# Patient Record
Sex: Female | Born: 1940 | Race: Black or African American | Hispanic: No | Smoking: Never smoker
Health system: Southern US, Community
[De-identification: ages and names within clinical notes are randomized; demographics above are authoritative.]

## PROBLEM LIST (undated history)

## (undated) DIAGNOSIS — R601 Generalized edema: Secondary | ICD-10-CM

## (undated) DIAGNOSIS — I10 Essential (primary) hypertension: Secondary | ICD-10-CM

## (undated) DIAGNOSIS — I472 Ventricular tachycardia: Secondary | ICD-10-CM

## (undated) DIAGNOSIS — E0590 Thyrotoxicosis, unspecified without thyrotoxic crisis or storm: Secondary | ICD-10-CM

## (undated) DIAGNOSIS — I4729 Other ventricular tachycardia: Secondary | ICD-10-CM

## (undated) DIAGNOSIS — D649 Anemia, unspecified: Secondary | ICD-10-CM

## (undated) DIAGNOSIS — E876 Hypokalemia: Secondary | ICD-10-CM

## (undated) DIAGNOSIS — R9431 Abnormal electrocardiogram [ECG] [EKG]: Secondary | ICD-10-CM

## (undated) DIAGNOSIS — E669 Obesity, unspecified: Secondary | ICD-10-CM

## (undated) DIAGNOSIS — I428 Other cardiomyopathies: Secondary | ICD-10-CM

## (undated) DIAGNOSIS — E049 Nontoxic goiter, unspecified: Secondary | ICD-10-CM

## (undated) DIAGNOSIS — I5022 Chronic systolic (congestive) heart failure: Secondary | ICD-10-CM

## (undated) HISTORY — PX: ABDOMINAL HYSTERECTOMY: SHX81

## (undated) HISTORY — PX: TUBAL LIGATION: SHX77

## (undated) HISTORY — PX: TONSILLECTOMY: SUR1361

## (undated) HISTORY — PX: OTHER SURGICAL HISTORY: SHX169

---

## 2007-06-01 ENCOUNTER — Emergency Department (HOSPITAL_COMMUNITY): Admission: EM | Admit: 2007-06-01 | Discharge: 2007-06-01 | Payer: Self-pay | Admitting: Emergency Medicine

## 2007-06-10 ENCOUNTER — Encounter: Admission: RE | Admit: 2007-06-10 | Discharge: 2007-06-10 | Payer: Self-pay | Admitting: Sports Medicine

## 2014-04-13 ENCOUNTER — Inpatient Hospital Stay (HOSPITAL_COMMUNITY)
Admission: EM | Admit: 2014-04-13 | Discharge: 2014-04-21 | DRG: 287 | Disposition: A | Discharge status: Home / Self Care | Payer: Medicare HMO | Attending: Internal Medicine | Admitting: Internal Medicine

## 2014-04-13 ENCOUNTER — Encounter (HOSPITAL_COMMUNITY): Payer: Self-pay | Admitting: Physical Medicine and Rehabilitation

## 2014-04-13 ENCOUNTER — Emergency Department (HOSPITAL_COMMUNITY): Payer: Medicare HMO

## 2014-04-13 DIAGNOSIS — I071 Rheumatic tricuspid insufficiency: Secondary | ICD-10-CM | POA: Diagnosis present

## 2014-04-13 DIAGNOSIS — E041 Nontoxic single thyroid nodule: Secondary | ICD-10-CM | POA: Diagnosis present

## 2014-04-13 DIAGNOSIS — E870 Hyperosmolality and hypernatremia: Secondary | ICD-10-CM | POA: Diagnosis present

## 2014-04-13 DIAGNOSIS — N189 Chronic kidney disease, unspecified: Secondary | ICD-10-CM | POA: Diagnosis present

## 2014-04-13 DIAGNOSIS — I129 Hypertensive chronic kidney disease with stage 1 through stage 4 chronic kidney disease, or unspecified chronic kidney disease: Secondary | ICD-10-CM | POA: Diagnosis present

## 2014-04-13 DIAGNOSIS — D61818 Other pancytopenia: Secondary | ICD-10-CM | POA: Diagnosis present

## 2014-04-13 DIAGNOSIS — E875 Hyperkalemia: Secondary | ICD-10-CM | POA: Diagnosis present

## 2014-04-13 DIAGNOSIS — R0602 Shortness of breath: Secondary | ICD-10-CM

## 2014-04-13 DIAGNOSIS — IMO0001 Reserved for inherently not codable concepts without codable children: Secondary | ICD-10-CM

## 2014-04-13 DIAGNOSIS — I429 Cardiomyopathy, unspecified: Secondary | ICD-10-CM | POA: Diagnosis present

## 2014-04-13 DIAGNOSIS — Z7982 Long term (current) use of aspirin: Secondary | ICD-10-CM | POA: Diagnosis not present

## 2014-04-13 DIAGNOSIS — R0600 Dyspnea, unspecified: Secondary | ICD-10-CM

## 2014-04-13 DIAGNOSIS — I272 Other secondary pulmonary hypertension: Secondary | ICD-10-CM | POA: Diagnosis present

## 2014-04-13 DIAGNOSIS — I472 Ventricular tachycardia: Secondary | ICD-10-CM | POA: Diagnosis present

## 2014-04-13 DIAGNOSIS — I10 Essential (primary) hypertension: Secondary | ICD-10-CM | POA: Diagnosis present

## 2014-04-13 DIAGNOSIS — D649 Anemia, unspecified: Secondary | ICD-10-CM | POA: Diagnosis present

## 2014-04-13 DIAGNOSIS — I5021 Acute systolic (congestive) heart failure: Secondary | ICD-10-CM | POA: Diagnosis present

## 2014-04-13 DIAGNOSIS — E876 Hypokalemia: Secondary | ICD-10-CM | POA: Diagnosis present

## 2014-04-13 DIAGNOSIS — E049 Nontoxic goiter, unspecified: Secondary | ICD-10-CM

## 2014-04-13 DIAGNOSIS — I428 Other cardiomyopathies: Secondary | ICD-10-CM

## 2014-04-13 DIAGNOSIS — I509 Heart failure, unspecified: Secondary | ICD-10-CM

## 2014-04-13 DIAGNOSIS — E8809 Other disorders of plasma-protein metabolism, not elsewhere classified: Secondary | ICD-10-CM | POA: Diagnosis present

## 2014-04-13 DIAGNOSIS — E0590 Thyrotoxicosis, unspecified without thyrotoxic crisis or storm: Secondary | ICD-10-CM | POA: Diagnosis present

## 2014-04-13 DIAGNOSIS — Z0389 Encounter for observation for other suspected diseases and conditions ruled out: Secondary | ICD-10-CM

## 2014-04-13 DIAGNOSIS — M7989 Other specified soft tissue disorders: Secondary | ICD-10-CM | POA: Diagnosis present

## 2014-04-13 DIAGNOSIS — I5023 Acute on chronic systolic (congestive) heart failure: Principal | ICD-10-CM | POA: Diagnosis present

## 2014-04-13 DIAGNOSIS — R9431 Abnormal electrocardiogram [ECG] [EKG]: Secondary | ICD-10-CM | POA: Diagnosis present

## 2014-04-13 DIAGNOSIS — R601 Generalized edema: Secondary | ICD-10-CM | POA: Diagnosis present

## 2014-04-13 HISTORY — DX: Essential (primary) hypertension: I10

## 2014-04-13 HISTORY — DX: Other ventricular tachycardia: I47.29

## 2014-04-13 HISTORY — DX: Abnormal electrocardiogram (ECG) (EKG): R94.31

## 2014-04-13 HISTORY — DX: Anemia, unspecified: D64.9

## 2014-04-13 HISTORY — DX: Other cardiomyopathies: I42.8

## 2014-04-13 HISTORY — DX: Generalized edema: R60.1

## 2014-04-13 HISTORY — DX: Chronic systolic (congestive) heart failure: I50.22

## 2014-04-13 HISTORY — DX: Hypokalemia: E87.6

## 2014-04-13 HISTORY — DX: Ventricular tachycardia: I47.2

## 2014-04-13 HISTORY — DX: Nontoxic goiter, unspecified: E04.9

## 2014-04-13 LAB — CBC WITH DIFFERENTIAL/PLATELET
Basophils Absolute: 0 10*3/uL (ref 0.0–0.1)
Basophils Relative: 0 % (ref 0–1)
Eosinophils Absolute: 0 10*3/uL (ref 0.0–0.7)
Eosinophils Relative: 1 % (ref 0–5)
HCT: 34.9 % — ABNORMAL LOW (ref 36.0–46.0)
Hemoglobin: 11 g/dL — ABNORMAL LOW (ref 12.0–15.0)
Lymphocytes Relative: 26 % (ref 12–46)
Lymphs Abs: 1.4 10*3/uL (ref 0.7–4.0)
MCH: 27.2 pg (ref 26.0–34.0)
MCHC: 31.5 g/dL (ref 30.0–36.0)
MCV: 86.4 fL (ref 78.0–100.0)
Monocytes Absolute: 0.6 10*3/uL (ref 0.1–1.0)
Monocytes Relative: 11 % (ref 3–12)
Neutro Abs: 3.3 10*3/uL (ref 1.7–7.7)
Neutrophils Relative %: 62 % (ref 43–77)
Platelets: 173 10*3/uL (ref 150–400)
RBC: 4.04 MIL/uL (ref 3.87–5.11)
RDW: 16.6 % — ABNORMAL HIGH (ref 11.5–15.5)
WBC: 5.3 10*3/uL (ref 4.0–10.5)

## 2014-04-13 LAB — TSH
TSH: 0.013 u[IU]/mL — ABNORMAL LOW (ref 0.350–4.500)
TSH: 0.046 u[IU]/mL — ABNORMAL LOW (ref 0.350–4.500)

## 2014-04-13 LAB — COMPREHENSIVE METABOLIC PANEL
ALT: 8 U/L (ref 0–35)
AST: 25 U/L (ref 0–37)
Albumin: 2.7 g/dL — ABNORMAL LOW (ref 3.5–5.2)
Alkaline Phosphatase: 111 U/L (ref 39–117)
Anion gap: 13 (ref 5–15)
BILIRUBIN TOTAL: 1.8 mg/dL — AB (ref 0.3–1.2)
BUN: 9 mg/dL (ref 6–23)
CALCIUM: 7.9 mg/dL — AB (ref 8.4–10.5)
CHLORIDE: 103 meq/L (ref 96–112)
CO2: 30 mmol/L (ref 19–32)
CREATININE: 0.33 mg/dL — AB (ref 0.50–1.10)
GFR calc Af Amer: >90 mL/min (ref >90)
GLUCOSE: 74 mg/dL (ref 70–99)
Potassium: 3.2 mmol/L — ABNORMAL LOW (ref 3.5–5.1)
Sodium: 146 mmol/L — ABNORMAL HIGH (ref 135–145)
Total Protein: 6.9 g/dL (ref 6.0–8.3)

## 2014-04-13 LAB — MAGNESIUM: Magnesium: 1.5 mg/dL (ref 1.5–2.5)

## 2014-04-13 LAB — I-STAT TROPONIN, ED: Troponin i, poc: 0.01 ng/mL (ref 0.00–0.08)

## 2014-04-13 LAB — BRAIN NATRIURETIC PEPTIDE: B Natriuretic Peptide: 1437.1 pg/mL — ABNORMAL HIGH (ref 0.0–100.0)

## 2014-04-13 MED ORDER — SODIUM CHLORIDE 0.9 % IJ SOLN
3.0000 mL | INTRAMUSCULAR | Status: DC | PRN
  Administered 2014-04-14: 3 mL via INTRAVENOUS
  Filled 2014-04-13: qty 3

## 2014-04-13 MED ORDER — POTASSIUM CHLORIDE CRYS ER 20 MEQ PO TBCR: 40.0000 meq | EXTENDED_RELEASE_TABLET | Freq: Once | ORAL | Status: DC

## 2014-04-13 MED ORDER — SODIUM CHLORIDE 0.9 % IV SOLN: 250.0000 mL | INTRAVENOUS | Status: DC | PRN

## 2014-04-13 MED ORDER — FUROSEMIDE 10 MG/ML IJ SOLN
40.0000 mg | Freq: Two times a day (BID) | INTRAMUSCULAR | Status: DC
  Administered 2014-04-13 – 2014-04-15 (×5): 40 mg via INTRAVENOUS
  Filled 2014-04-13 (×11): qty 4

## 2014-04-13 MED ORDER — LISINOPRIL 10 MG PO TABS
10.0000 mg | ORAL_TABLET | Freq: Every day | ORAL | Status: DC
  Administered 2014-04-13 – 2014-04-14 (×2): 10 mg via ORAL
  Filled 2014-04-13 (×3): qty 1

## 2014-04-13 MED ORDER — ALPRAZOLAM 0.25 MG PO TABS
0.2500 mg | ORAL_TABLET | Freq: Once | ORAL | Status: AC
  Administered 2014-04-13: 0.25 mg via ORAL
  Filled 2014-04-13: qty 1

## 2014-04-13 MED ORDER — ASPIRIN EC 81 MG PO TBEC
81.0000 mg | DELAYED_RELEASE_TABLET | Freq: Every day | ORAL | Status: DC
  Administered 2014-04-13 – 2014-04-21 (×8): 81 mg via ORAL
  Filled 2014-04-13 (×9): qty 1

## 2014-04-13 MED ORDER — HEPARIN SODIUM (PORCINE) 5000 UNIT/ML IJ SOLN
5000.0000 [IU] | Freq: Three times a day (TID) | INTRAMUSCULAR | Status: DC
  Administered 2014-04-13 – 2014-04-17 (×13): 5000 [IU] via SUBCUTANEOUS
  Filled 2014-04-13 (×19): qty 1

## 2014-04-13 MED ORDER — POTASSIUM CHLORIDE CRYS ER 20 MEQ PO TBCR
40.0000 meq | EXTENDED_RELEASE_TABLET | ORAL | Status: AC
  Administered 2014-04-13 (×2): 40 meq via ORAL

## 2014-04-13 MED ORDER — POTASSIUM CHLORIDE CRYS ER 20 MEQ PO TBCR
80.0000 meq | EXTENDED_RELEASE_TABLET | Freq: Once | ORAL | Status: DC
  Filled 2014-04-13: qty 4

## 2014-04-13 MED ORDER — SODIUM CHLORIDE 0.9 % IJ SOLN
3.0000 mL | Freq: Two times a day (BID) | INTRAMUSCULAR | Status: DC
  Administered 2014-04-13 – 2014-04-21 (×13): 3 mL via INTRAVENOUS

## 2014-04-13 MED ORDER — ACETAMINOPHEN 325 MG PO TABS
650.0000 mg | ORAL_TABLET | ORAL | Status: DC | PRN
  Administered 2014-04-13 – 2014-04-17 (×3): 650 mg via ORAL
  Filled 2014-04-13 (×3): qty 2

## 2014-04-13 MED ORDER — ONDANSETRON HCL 4 MG/2ML IJ SOLN: 4.0000 mg | Freq: Four times a day (QID) | INTRAMUSCULAR | Status: DC | PRN

## 2014-04-13 NOTE — ED Notes (Signed)
Attempted report 

## 2014-04-13 NOTE — ED Notes (Signed)
Pt refused wheelchair in triage, states "I can walk, I have legs."

## 2014-04-13 NOTE — Progress Notes (Signed)
Patient is very anxious and appears to be having difficulty relaxing which is making heart rate slightly elevated to the 120's and also when going the restroom. Notified on-call doctor, Dr. Shirlee Latch. Orders given for one time dose Xanax per order. Will administer as ordered and continue to monitor patient to end of shift.

## 2014-04-13 NOTE — ED Notes (Signed)
Pt presents to department for evaluation of SOB. Ongoing x2 week. Pt states she has difficulty breathing with exertion. Denies chest pain. Respirations unlabored. Speaking complete sentences. Pt is alert and oriented x4.

## 2014-04-13 NOTE — H&P (Signed)
Patient ID: Leslie Kline MRN: [id number], DOB/AGE: 73/10/1940   Admit date: 04/13/2014   Primary Physician: No established PCP Primary Cardiologist: New  Pt. Profile: 74 year old female with no past medical history presenting to the ED with complaints of worsening dyspnea x one week.   Problem List  Past Medical History  Diagnosis Date  . Hypertension     History reviewed. No pertinent past surgical history.   Allergies  No Known Allergies  HPI The patient is a 74 year old African-American female with no known past medical history. She does not seek routine medical/preventative care. She reports that she has not been evaluated by a medical provider in over 6 years. She takes no medications at home. She denies any family history of heart disease. She denies any personal history of tobacco use, past or present. 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  She was in her usual state of health until one week ago when she developed dyspnea on exertion. She also notes a nonproductive cough that started last week. She denies any associated fevers or chills, nausea vomiting or diarrhea. No associated chest pain, lightheadedness, dizziness, syncope/near-syncope. She has had no dyspnea at rest. Symptoms occur with exertion. She denies orthopnea/PND but notes bilateral lower extremity edema. Symptoms have progressively worsened over the last week. She denies melena/hematochezia. In regards to her diet, she denies any excessive salt intake.  In the ED, workup is notable for an elevated BNP at at 1,437. Initial troponin is negative. CBC demonstrates mild anemia with Hgb of 11. BMP is notable for mild hypokalemia with K of 3.2 and hypoalbuminemia at 2.7. Scr is 0.33. CXR demonstrates moderate to severe cardiomegaly,  moderate right and small left pleural effusions with associated lung base collapse vs consolidation. EKG demonstrates sinus tach with ventricular rates in  the 120s and prolonged QT/QTc of 402/577.    Home Medications  Prior to Admission medications   Medication Sig Start Date End Date Taking? Authorizing Provider  aspirin 325 MG EC tablet Take 325 mg by mouth every 6 (six) hours as needed for pain.   Yes Historical Provider, MD  aspirin-sod bicarb-citric acid (ALKA-SELTZER) 325 MG TBEF tablet Take 325 mg by mouth every 6 (six) hours as needed.   Yes Historical Provider, MD    Family History  Family History  Problem Relation Age of Onset  . Diabetes Maternal Grandfather     Social History  History   Social History  . Marital Status: Single    Spouse Name: N/A    Number of Children: N/A  . Years of Education: N/A   Occupational History  . Not on file.   Social History Main Topics  . Smoking status: Never Smoker   . Smokeless tobacco: Not on file  . Alcohol Use: No  . Drug Use: No  . Sexual Activity: Not on file   Other Topics Concern  . Not on file   Social History Narrative  . No narrative on file     Review of Systems General:  No chills, fever, night sweats or weight changes.  Cardiovascular:  No chest pain, dyspnea on exertion, edema, orthopnea, palpitations, paroxysmal nocturnal dyspnea. Dermatological: No rash, lesions/masses Respiratory: No cough, dyspnea Urologic: No hematuria, dysuria Abdominal:   No nausea, vomiting, diarrhea, bright red blood per rectum, melena, or hematemesis Neurologic:  No visual changes, wkns, changes in mental status. All other systems reviewed and are otherwise negative except as noted above.  Physical Exam  Blood pressure 126/79, pulse 112, temperature 97.6 F (36.4 C), temperature source Oral, resp. rate 21, SpO2 97 %.  General: Pleasant, NAD Psych: Normal affect. Neuro: Alert and oriented X 3. Moves all extremities spontaneously. HEENT: Normal  Neck: Supple without bruits. + JVD. Lungs:  Decreased BS over RLL, no wheenzing, rhonchi or rales Heart: tachy rate, regular  rhythm no s3, s4, or murmurs. Abdomen: Soft, non-tender, non-distended, BS + x 4.  Extremities: No clubbing or cyanosis. DP/PT/Radials 2+ and equal bilaterally. 2+ bilateral LE pitting edema  Labs  Troponin (Point of Care Test)  Recent Labs  04/13/14 1428  TROPIPOC 0.01   No results for input(s): CKTOTAL, CKMB, TROPONINI in the last 72 hours. Lab Results  Component Value Date   WBC 5.3 04/13/2014   HGB 11.0* 04/13/2014   HCT 34.9* 04/13/2014   MCV 86.4 04/13/2014   PLT 173 04/13/2014     Recent Labs Lab 04/13/14 1418  NA 146*  K 3.2*  CL 103  CO2 30  BUN 9  CREATININE 0.33*  CALCIUM 7.9*  PROT 6.9  BILITOT 1.8*  ALKPHOS 111  ALT 8  AST 25  GLUCOSE 74   No results found for: CHOL, HDL, LDLCALC, TRIG No results found for: DDIMER   Radiology/Studies  Dg Chest 2 View  04/13/2014   CLINICAL DATA:  74 year old female with shortness of Breath. Initial encounter.  EXAM: CHEST  2 VIEW  COMPARISON:  None.  FINDINGS: Cardiomegaly. Moderate right and small left pleural effusions. Associated bibasilar opacification. No pneumothorax. No acute pulmonary edema. Leftward deviation of the trachea at the thoracic inlet suggesting right thyroid goiter. Small surgical clips at the level of the left thyroid bed. No acute osseous abnormality identified.  IMPRESSION: 1. Moderate to severe cardiomegaly, consider pericardial effusion. 2. Moderate right and small left pleural effusions with associated lung base collapse or consolidation. 3. 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 Possible previous left thyroidectomy.   Electronically Signed   By: Augusto Gamble M.D.   On: 04/13/2014 13:11    ECG  Sinus tach with prolonged QT/QTC of 402/577   ASSESSMENT AND PLAN  1. Acute CHF: Worsening DOE x 1 week. PE notable for elevated JVD, 2+ bilateral pitting LEE and decreased breath sounds at the bases. Labs are notable for elevated BNP at 1,437. CXR notable for moderate to severe cardiomegaly and moderate  right pleural effusion and mild left pleural effusion. She is hemodynamically stable. Diastolic BP is elevated at 100. O2 stats fluctuating in the low 90s-upper 80s. Will place on supplemental O2. Will obtain an echo to r/o pericardial effusion and will also assess wall motion as well as systolic and diastolic function. Will also check TSH. Renal function is normal. Will treat with IV Lasix. 40 mg BID. Will start 10 mg of lisinopril for afterload reduction. May also consider adding hydralazine + nitrate later if BP allows. Will consider initiating a BB tomorrow (likely Coreg) after she diuresis.  Will order strict I/Os, daily weights and low sodium diet. Daily BMP to closely follow electrolytes. If echo shows decreased systolic function, she will need a LHC after she diuresis.  2. Hypokalemia: Etiology unknown. She has not been on any diuretics as an OP and denies and recent diarrhea or vomiting. Will repleat with supplemental K-Dur and will continue to follow closely. Will check Mg.  3. Prolonged QT: not on any home medications. She is hypokalemic. Will correct with supplemental K and will check Mg. Will avoid any  potential QT prolonging agents, including PRN Zofran.     Signed, Robbie Lis, PA-C 04/13/2014, 4:48 PM  Personally seen and examined. Agree with above.  74 year old with no prior medical contact here with  acute systolic heart failure.  Exam reveals bilateral lower extremity edema, tachycardia with S3 gallop, elevated JVD jawline.  It is plausible that she has developed hypertensive cardiomyopathy based up on her blood pressures or perhaps viral cardiomyopathy given the acute onset of this illness. She was in her usual state of health up until approximately 1 week ago.  -Aggressive diuresis with IV Lasix -Echocardiogram -TSH -If ejection fraction is markedly reduced as suspected, after acute decompensation episode, she will need heart catheterization to further evaluate her  anatomy. -Aggressively replete potassium -Aggressive afterload reduction. Blood pressure should improve with diuresis. -Initiate beta blocker once volume reduced.  Donato Schultz, MD

## 2014-04-13 NOTE — ED Notes (Signed)
Patient has had a snack and has been back to the bathroom.  She is able to ambulate with little to no assist.

## 2014-04-13 NOTE — ED Provider Notes (Signed)
CSN: [id number]     Arrival date & time 04/13/14  1221 History   First MD Initiated Contact with Patient 04/13/14 1235     Chief Complaint  Patient presents with  . Shortness of Breath     (Consider location/radiation/quality/duration/timing/severity/associated sxs/prior Treatment) HPI  Leslie Kline is a 74 y.o. female with PMH of hypertension, cardiomegaly presenting with 2 weeks worth of shortness of breath worse with exertion. Patient states she also has difficulty breathing with exertion. She denies worsening lower leg and down. She has no home inhalers. She states she was never smoker. She denies any chest pain. She denies any nausea, vomiting, diaphoresis, dizziness. No syncope or falls. Patient denies history of malignancy, DVT, PE, recent surgery or trauma, immobilization, hemoptysis, immobilization. Patient states at times she feels warm and Hot room but denies fevers, chills, cough, congestion. Patient has not seen a general doctor in many years. Patient with a lot of stress losing her husband and son in the past couple years.   Past Medical History  Diagnosis Date  . Hypertension    History reviewed. No pertinent past surgical history. Family History  Problem Relation Age of Onset  . Diabetes Maternal Grandfather    History  Substance Use Topics  . Smoking status: Never Smoker   . Smokeless tobacco: Not on file  . Alcohol Use: No   OB History    No data available     Review of Systems 10 Systems reviewed and are negative for acute change except as noted in the HPI.    Allergies  Review of patient's allergies indicates no known allergies.  Home Medications   Prior to Admission medications   Medication Sig Start Date End Date Taking? Authorizing Provider  aspirin 325 MG EC tablet Take 325 mg by mouth every 6 (six) hours as needed for pain.   Yes Historical Provider, MD  aspirin-sod bicarb-citric acid (ALKA-SELTZER) 325 MG TBEF tablet Take 325 mg by mouth every 6  (six) hours as needed.   Yes Historical Provider, MD   BP 153/96 mmHg  Pulse 120  Temp(Src) 97.6 F (36.4 C) (Oral)  Resp 25  SpO2 83% Physical Exam  Constitutional: She appears well-developed and well-nourished. No distress.  HENT:  Head: Normocephalic and atraumatic.  Eyes: Conjunctivae and EOM are normal. Right eye exhibits no discharge. Left eye exhibits no discharge.  Neck: JVD present.  Cardiovascular: Regular rhythm.   Tachycardia 2+ pitting edema to bilateral knees. Patient denies tenderness. tenderness. no palpable cords.  Pulmonary/Chest: Effort normal and breath sounds normal. No respiratory distress. She has no wheezes.  Abdominal: Soft. Bowel sounds are normal. She exhibits no distension. There is no tenderness.  Neurological: She is alert. She exhibits normal muscle tone. Coordination normal.  Skin: Skin is warm and dry. She is not diaphoretic.  Nursing note and vitals reviewed.   ED Course  Procedures (including critical care time) Labs Review Labs Reviewed  BRAIN NATRIURETIC PEPTIDE - Abnormal; Notable for the following:    B Natriuretic Peptide 1437.1 (*)    All other components within normal limits  CBC WITH DIFFERENTIAL - Abnormal; Notable for the following:    Hemoglobin 11.0 (*)    HCT 34.9 (*)    RDW 16.6 (*)    All other components within normal limits  COMPREHENSIVE METABOLIC PANEL - Abnormal; Notable for the following:    Sodium 146 (*)    Potassium 3.2 (*)    Creatinine, Ser 0.33 (*)    Calcium  7.9 (*)    Albumin 2.7 (*)    Total Bilirubin 1.8 (*)    All other components within normal limits  TSH - Abnormal; Notable for the following:    TSH 0.013 (*)    All other components within normal limits  Rosezena Sensor, ED    Imaging Review Dg Chest 2 View  04/13/2014   CLINICAL DATA:  74 year old female with shortness of Breath. Initial encounter.  EXAM: CHEST  2 VIEW  COMPARISON:  None.  FINDINGS: Cardiomegaly. Moderate right and small left  pleural effusions. Associated bibasilar opacification. No pneumothorax. No acute pulmonary edema. Leftward deviation of the trachea at the thoracic inlet suggesting right thyroid goiter. Small surgical clips at the level of the left thyroid bed. No acute osseous abnormality identified.  IMPRESSION: 1. Moderate to severe cardiomegaly, consider pericardial effusion. 2. Moderate right and small left pleural effusions with associated lung base collapse or consolidation. 3. 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 Possible previous left thyroidectomy.   Electronically Signed   By: Augusto Gamble M.D.   On: 04/13/2014 13:11     EKG Interpretation   Date/Time:  Wednesday April 13 2014 12:30:54 EST Ventricular Rate:  124 PR Interval:  128 QRS Duration: 82 QT Interval:  402 QTC Calculation: 577 R Axis:   71 Text Interpretation:  Critical Test Result: Long QTc Sinus tachycardia  Anteroseptal infarct , age undetermined Abnormal ECG No old tracing to  compare Confirmed by CAMPOS  MD, Caryn Bee ([id number]) on 04/13/2014 2:44:34 PM      MDM   Final diagnoses:  Congestive heart failure, unspecified congestive heart failure chronicity, unspecified congestive heart failure type  Dyspnea   Patient with history of cardiomegaly presenting with persistent shortness of breath as well as bilateral pitting edema. Patient denies any chest pain at this time. She is tachycardic and tachypnea. No hypoxia. EKG with sinus tachycardia as well as prolonged QT. Initial troponin negative. Patient also with hyperkalemia to 3.2 and anemia. Patient with moderate right and small left pleural effusions with possible lung base collapse or consolidation. Patient also with moderate to severe cardiomegaly. No frank pulmonary edema. Patient with BNP of 1400. Patient with likely congestive heart failure exacerbation. Consult to cardiology who agrees to evaluate the patient with plan for admission.  Discussed all results and patient verbalizes  understanding and agrees with plan.  This is a shared patient. This patient was discussed with the physician, Dr. Patria Mane who saw and evaluated the patient and agrees with the plan.     Louann Sjogren, PA-C 04/13/14 1702  Lyanne Co, MD 04/14/14 [phone]

## 2014-04-14 ENCOUNTER — Encounter (HOSPITAL_COMMUNITY): Payer: Self-pay | Admitting: General Practice

## 2014-04-14 DIAGNOSIS — I509 Heart failure, unspecified: Secondary | ICD-10-CM

## 2014-04-14 DIAGNOSIS — R0600 Dyspnea, unspecified: Secondary | ICD-10-CM | POA: Insufficient documentation

## 2014-04-14 LAB — BASIC METABOLIC PANEL
Anion gap: 9 (ref 5–15)
BUN: 9 mg/dL (ref 6–23)
CO2: 32 mmol/L (ref 19–32)
CREATININE: 0.35 mg/dL — AB (ref 0.50–1.10)
Calcium: 7.5 mg/dL — ABNORMAL LOW (ref 8.4–10.5)
Chloride: 103 mEq/L (ref 96–112)
GFR calc Af Amer: >90 mL/min (ref >90)
GFR calc non Af Amer: >90 mL/min (ref >90)
Glucose, Bld: 89 mg/dL (ref 70–99)
Potassium: 2.5 mmol/L — CL (ref 3.5–5.1)
Sodium: 144 mmol/L (ref 135–145)

## 2014-04-14 LAB — POTASSIUM: Potassium: 4 mmol/L (ref 3.5–5.1)

## 2014-04-14 MED ORDER — LORAZEPAM 2 MG/ML IJ SOLN
0.5000 mg | Freq: Three times a day (TID) | INTRAMUSCULAR | Status: AC | PRN
  Administered 2014-04-15 – 2014-04-19 (×2): 0.5 mg via INTRAVENOUS
  Filled 2014-04-14 (×2): qty 1

## 2014-04-14 MED ORDER — DIPHENHYDRAMINE HCL 25 MG PO CAPS
25.0000 mg | ORAL_CAPSULE | Freq: Once | ORAL | Status: AC
  Administered 2014-04-15: 25 mg via ORAL
  Filled 2014-04-14: qty 1

## 2014-04-14 MED ORDER — POTASSIUM CHLORIDE CRYS ER 20 MEQ PO TBCR
40.0000 meq | EXTENDED_RELEASE_TABLET | ORAL | Status: AC
  Administered 2014-04-14 (×3): 40 meq via ORAL
  Filled 2014-04-14 (×3): qty 2

## 2014-04-14 MED ORDER — POTASSIUM CHLORIDE CRYS ER 20 MEQ PO TBCR
40.0000 meq | EXTENDED_RELEASE_TABLET | Freq: Three times a day (TID) | ORAL | Status: DC
  Administered 2014-04-14 – 2014-04-18 (×13): 40 meq via ORAL
  Filled 2014-04-14 (×19): qty 2

## 2014-04-14 NOTE — Progress Notes (Signed)
CRITICAL VALUE ALERT  Critical value received:  Potassium - 2.5  Date of notification:  04/14/2014  Time of notification:  0559  Critical value read back:Yes.    Nurse who received alert:  Milta Deiters  MD notified (1st page): Ward Givens, NP  Time of first page:  0615  MD notified (2nd page): n/a  Time of second page: n/a  Responding MD: Ward Givens, NP  Time MD responded:  [phone]

## 2014-04-14 NOTE — Care Management Note (Unsigned)
    Page 1 of 1   04/20/2014     5:50:12 PM CARE MANAGEMENT NOTE 04/20/2014  Patient:  Leslie Kline, Leslie Kline   Account Number:  [id number]  Date Initiated:  04/14/2014  Documentation initiated by:  Floyce Bujak  Subjective/Objective Assessment:   Pt adm on 04/13/14 with acute CHF.  PTA, pt resides at home with grandchildren.     Action/Plan:   Will follow for dc needs as pt progresses.   Anticipated DC Date:  04/21/2014   Anticipated DC Plan:  HOME W HOME HEALTH SERVICES      DC Planning Services  CM consult      Choice offered to / List presented to:     DME arranged  OTHER - SEE COMMENT      DME agency  OTHER - SEE NOTE        Status of service:  In process, will continue to follow Medicare Important Message given?  YES (If response is "NO", the following Medicare IM given date fields will be blank) Date Medicare IM given:  04/18/2014 Medicare IM given by:  Tiphany Fayson Date Additional Medicare IM given:   Additional Medicare IM given by:    Discharge Disposition:    Per UR Regulation:  Reviewed for med. necessity/level of care/duration of stay  If discussed at Long Length of Stay Meetings, dates discussed:    Comments:  04/20/14 Sidney Ace, RN, BSN [phone] Pt to be fitted with Lifevest on 1/28, per [employer] rep, then plan for dc.  04/19/14 Sidney Ace, RN, BSN [phone] PT recommending no OP follow up at dc.  Noted MD considering Lifevest.  04/18/14 Sidney Ace, RN, BSN [phone] Cath today; cont IV diuresisi.  Will cont to follow progress.

## 2014-04-14 NOTE — Progress Notes (Signed)
Echocardiogram 2D Echocardiogram has been performed.  Leslie Kline 04/14/2014, 10:21 AM

## 2014-04-14 NOTE — Progress Notes (Signed)
During the night patient was getting out of bed independently to the bathroom on several occassions after being advised to call for nurse when she needs to get up. Patient felt that nurse was hindering her when nurse suggested to put the bedside commode next to bed for safety issues for the patient appeared less steady in ambulating to the bathroom during the night. Patient is very independent and states she will not fall. Explained to patient the risk of not calling for assistance when she is weak on her feet. Patient agreed to some extent and agreed to call patient. Will continue to monitor patient to end of shift.

## 2014-04-14 NOTE — Progress Notes (Signed)
Patient Name: Leslie Kline Date of Encounter: 04/14/2014     Active Problems:   Acute CHF    SUBJECTIVE  Denies ever had any CP. States she had L sided thyroidectomy for goiter in the past. Does not have a PCP. States breathing got better after IV lasix.   CURRENT MEDS . aspirin EC  81 mg Oral Daily  . furosemide  40 mg Intravenous BID  . heparin  5,000 Units Subcutaneous 3 times per day  . lisinopril  10 mg Oral Daily  . potassium chloride  40 mEq Oral TID  . sodium chloride  3 mL Intravenous Q12H    OBJECTIVE  Filed Vitals:   04/13/14 1859 04/13/14 1951 04/14/14 0427 04/14/14 1031  BP: 120/92 140/95 146/68 98/52  Pulse: 110 122 100   Temp: 97 F (36.1 C) 98.6 F (37 C) 98 F (36.7 C)   TempSrc: Oral Oral Axillary   Resp: 20 18 18    Height: 5\' 6"  (1.676 m)     Weight: 171 lb 6.4 oz (77.747 kg)  169 lb 4.8 oz (76.794 kg)   SpO2: 100% 100% 100%     Intake/Output Summary (Last 24 hours) at 04/14/14 1042 Last data filed at 04/14/14 1020  Gross per 24 hour  Intake   1220 ml  Output   1950 ml  Net   -730 ml   Filed Weights   04/13/14 1859 04/14/14 0427  Weight: 171 lb 6.4 oz (77.747 kg) 169 lb 4.8 oz (76.794 kg)    PHYSICAL EXAM  General: Pleasant, NAD. Neuro: Alert and oriented X 3. Moves all extremities spontaneously. Psych: Normal affect. HEENT:  Normal  Neck: Supple without bruits or JVD. Lungs:  Resp regular and unlabored, CTA. Heart: mildly tachycardic. no s3, s4, or murmurs. Abdomen: Soft, non-tender, non-distended, BS + x 4.  Extremities: No clubbing, cyanosis. DP/PT/Radials 2+ and equal bilaterally. 1-2+ pitting edema in LE  Accessory Clinical Findings  CBC  Recent Labs  04/13/14 1418  WBC 5.3  NEUTROABS 3.3  HGB 11.0*  HCT 34.9*  MCV 86.4  PLT 173   Basic Metabolic Panel  Recent Labs  04/13/14 1418 04/13/14 2032 04/14/14 0440  NA 146*  --  144  K 3.2*  --  2.5*  CL 103  --  103  CO2 30  --  32  GLUCOSE 74  --  89  BUN 9   --  9  CREATININE 0.33*  --  0.35*  CALCIUM 7.9*  --  7.5*  MG  --  1.5  --    Liver Function Tests  Recent Labs  04/13/14 1418  AST 25  ALT 8  ALKPHOS 111  BILITOT 1.8*  PROT 6.9  ALBUMIN 2.7*   Thyroid Function Tests  Recent Labs  04/13/14 2032  TSH 0.046*    TELE Sinus tach with HR 100s    ECG  No new EKG  Echocardiogram  pending    Radiology/Studies  Dg Chest 2 View  04/13/2014   CLINICAL DATA:  74 year old female with shortness of Breath. Initial encounter.  EXAM: CHEST  2 VIEW  COMPARISON:  None.  FINDINGS: Cardiomegaly. Moderate right and small left pleural effusions. Associated bibasilar opacification. No pneumothorax. No acute pulmonary edema. Leftward deviation of the trachea at the thoracic inlet suggesting right thyroid goiter. Small surgical clips at the level of the left thyroid bed. No acute osseous abnormality identified.  IMPRESSION: 1. Moderate to severe cardiomegaly, consider pericardial effusion. 2. Moderate right  and small left pleural effusions with associated lung base collapse or consolidation. 3. 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 Possible previous left thyroidectomy.   Electronically Signed   By: Augusto Gamble M.D.   On: 04/13/2014 13:11    ASSESSMENT AND PLAN  74 year old with no prior medical contact here with acute systolic heart failure.  1. Acute CHF with worsening DOE x 1wk  - CXR shows moderate R pleural effusion and mild L pleural effusion. BNP 1437  - continue IV diuresis, if EF low, may consider L&R heart cath vs myoview as well, however given new finding of abnormal thyroid, her condition may be thyroid related  - pending echo. Consider add coreg later once BP allows  2. Likely hyperthyroidism, TSH 0.046 this morning  - check free T4. 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  HTN 4. Hypokalemia 5. Prolonged QT  Signed, Azalee Course PA-C Pager: [phone]

## 2014-04-14 NOTE — Progress Notes (Signed)
Notified on-call, Ward Givens, NP of low potassium of 2.5. Orders given. Will carry out orders and continue to monitor patient to end of shift.

## 2014-04-15 DIAGNOSIS — E0590 Thyrotoxicosis, unspecified without thyrotoxic crisis or storm: Secondary | ICD-10-CM

## 2014-04-15 DIAGNOSIS — R9431 Abnormal electrocardiogram [ECG] [EKG]: Secondary | ICD-10-CM | POA: Diagnosis present

## 2014-04-15 DIAGNOSIS — I5082 Biventricular heart failure: Secondary | ICD-10-CM | POA: Insufficient documentation

## 2014-04-15 DIAGNOSIS — E876 Hypokalemia: Secondary | ICD-10-CM | POA: Diagnosis present

## 2014-04-15 DIAGNOSIS — E049 Nontoxic goiter, unspecified: Secondary | ICD-10-CM

## 2014-04-15 DIAGNOSIS — D649 Anemia, unspecified: Secondary | ICD-10-CM | POA: Diagnosis present

## 2014-04-15 LAB — T4, FREE: Free T4: 5.36 ng/dL — ABNORMAL HIGH (ref 0.80–1.80)

## 2014-04-15 LAB — BASIC METABOLIC PANEL
ANION GAP: 10 (ref 5–15)
BUN: 11 mg/dL (ref 6–23)
CO2: 30 mmol/L (ref 19–32)
Calcium: 7.8 mg/dL — ABNORMAL LOW (ref 8.4–10.5)
Chloride: 107 mEq/L (ref 96–112)
Creatinine, Ser: 0.47 mg/dL — ABNORMAL LOW (ref 0.50–1.10)
GFR calc Af Amer: >90 mL/min (ref >90)
GFR calc non Af Amer: >90 mL/min (ref >90)
Glucose, Bld: 101 mg/dL — ABNORMAL HIGH (ref 70–99)
Potassium: 3.9 mmol/L (ref 3.5–5.1)
SODIUM: 147 mmol/L — AB (ref 135–145)

## 2014-04-15 LAB — MAGNESIUM: Magnesium: 1.4 mg/dL — ABNORMAL LOW (ref 1.5–2.5)

## 2014-04-15 MED ORDER — LISINOPRIL 2.5 MG PO TABS
2.5000 mg | ORAL_TABLET | Freq: Every day | ORAL | Status: DC
  Administered 2014-04-15 – 2014-04-21 (×6): 2.5 mg via ORAL
  Filled 2014-04-15 (×7): qty 1

## 2014-04-15 MED ORDER — CARVEDILOL 12.5 MG PO TABS
12.5000 mg | ORAL_TABLET | Freq: Two times a day (BID) | ORAL | Status: DC
  Administered 2014-04-15 – 2014-04-21 (×13): 12.5 mg via ORAL
  Filled 2014-04-15 (×14): qty 1

## 2014-04-15 MED ORDER — MAGNESIUM SULFATE 4 GM/100ML IV SOLN
4.0000 g | Freq: Once | INTRAVENOUS | Status: AC
  Administered 2014-04-15: 4 g via INTRAVENOUS
  Filled 2014-04-15: qty 100

## 2014-04-15 MED ORDER — METOPROLOL TARTRATE 25 MG PO TABS
25.0000 mg | ORAL_TABLET | Freq: Two times a day (BID) | ORAL | Status: DC
  Administered 2014-04-15: 25 mg via ORAL
  Filled 2014-04-15 (×2): qty 1

## 2014-04-15 MED ORDER — MAGNESIUM OXIDE 400 (241.3 MG) MG PO TABS
400.0000 mg | ORAL_TABLET | Freq: Every day | ORAL | Status: DC
  Administered 2014-04-16 – 2014-04-21 (×5): 400 mg via ORAL
  Filled 2014-04-15 (×6): qty 1

## 2014-04-15 MED ORDER — METHIMAZOLE 10 MG PO TABS
10.0000 mg | ORAL_TABLET | Freq: Three times a day (TID) | ORAL | Status: DC
  Administered 2014-04-15 – 2014-04-21 (×19): 10 mg via ORAL
  Filled 2014-04-15 (×21): qty 1

## 2014-04-15 NOTE — Progress Notes (Signed)
Pt wants to sit in the recliner chair, refused chair alarm, pt got irritable when told to call for help and verbalized that she can do everything for herself and doesn't need help, reminded pt that it's for her safety but got more agitated.

## 2014-04-15 NOTE — Consult Note (Signed)
[facility] Medical Consultation  Leslie Kline VAN:[id number] DOB: 11/30/1940 DOA: 04/13/2014 PCP: No primary care provider on file.   Requesting physician: cardiology Date of consultation: 04/15/14 Reason for consultation: hyperthyroidism  Impression/Recommendations Active Problems:   Acute systolic CHF (congestive heart failure)   Dyspnea   Biventricular heart failure   Hyperthyroidism   Thyroid goiter   Prolonged QT interval   Hypomagnesemia   Hypokalemia   Anemia   Primary Hyperthyroidism: Elevated free t4 and suppressed tsh, . Free t3 ordered, and TRab ordered for further evaluation. Methimazole started today by cardiology. Recommend continue methimazole. She would optimally need a radio iodine uptake scan before starting methimazole, and it should be done atleast 6 to 8 weeks after getting the IV contrast and atleast be a week off the methimazole before we do the radio iodine uptake scan. Patient scheduled for cardiac cath on Monday  for severe systolic heart failure. In view of her severity of the heart failure, recommend outpatient work up with the scan and a referral for endocrinology when she gets discharged.  We can get an plain US thyroid to evaluate further  Acute systolic heart failure Further management as per cardiology   Right  Fore arm swelling.  Unclear if its new or old. Will order US dopplers of the right upper extremity. She denies any pain and there is no erythema.   Hypertension Better controlled.   Mild hypernatremia Monitor.   Hypomagnesemia:  Will be replaced as needed.   I will followup again tomorrow. Please contact me if I can be of assistance in the meanwhile. Thank you for this consultation.  Chief Complaint: sob on exertion.   HPI:  74 year old lady with prior h/o hypertension, thyroid disease, s/p left thyroidectomy 15 years ago by Dr Nancy Marus, comes in for worsening sob on exertion. She also reports unintentional weight loss of 260 lbs to  165 in the last one year. She reports fatigue and palpitations occasionally. She was admitted to cardiology service for heart failure and was started on IV lasix for diuresis. Her thyroid panel revealed suppressed TSH and elevated free t4 and we were consulted for further evaluation. cxr showed right thyroid goiter.   Review of Systems:  See HPI for pertinent positive history. Patient had sparing medical care prior to admission and is very vague about her symptoms and not a great historian.   Past Medical History  Diagnosis Date  . Hypertension   . CHF (congestive heart failure)   . Hypokalemia 04/13/2014  . Shortness of breath dyspnea    Past Surgical History  Procedure Laterality Date  . Tubal ligation    . Goiter    . Tonsillectomy    . Abdominal hysterectomy     Social History:  reports that she has never smoked. She has never used smokeless tobacco. She reports that she does not drink alcohol or use illicit drugs.  No Known Allergies Family History  Problem Relation Age of Onset  . Diabetes Maternal Grandfather     Prior to Admission medications   Medication Sig Start Date End Date Taking? Authorizing Provider  aspirin 325 MG EC tablet Take 325 mg by mouth every 6 (six) hours as needed for pain.   Yes Historical Provider, MD  aspirin-sod bicarb-citric acid (ALKA-SELTZER) 325 MG TBEF tablet Take 325 mg by mouth every 6 (six) hours as needed.   Yes Historical Provider, MD   Physical Exam: Blood pressure 124/83, pulse 110, temperature 98 F (36.7 C), temperature  source Oral, resp. rate 16, height  (1.676 m), weight 75.479 kg (166 lb 6.4 oz), SpO2 98 %. Filed Vitals:   04/15/14 1044  BP: 124/83  Pulse: 110  Temp:   Resp:      General:  Alert afebrile comfortable  Eyes: PERLA, non icteric.   Neck: right sided goiter present.   Cardiovascular: s1s2,  Systolic murmer 2/6  present.   Respiratory: diminished breath sounds at bases, no wheezing or  rhonchi  Abdomen: soft non tender non distended bowel sounds heard  Skin: no rash  Musculoskeletal: swelling of the right forearm when compared to left forearm.  Neurologic: no focal deficits. Speech normal. No facial droop. Able to move all extremities. Gait was not tested.   Labs on Admission:  Basic Metabolic Panel:  Recent Labs Lab 04/13/14 1418 04/13/14 2032 04/14/14 0440 04/14/14 1447 04/15/14 0340 04/15/14 0402  NA 146*  --  144  --  147*  --   K 3.2*  --  2.5* 4.0 3.9  --   CL 103  --  103  --  107  --   CO2 30  --  32  --  30  --   GLUCOSE 74  --  89  --  101*  --   BUN 9  --  9  --  11  --   CREATININE 0.33*  --  0.35*  --  0.47*  --   CALCIUM 7.9*  --  7.5*  --  7.8*  --   MG  --  1.5  --   --   --  1.4*   Liver Function Tests:  Recent Labs Lab 04/13/14 1418  AST 25  ALT 8  ALKPHOS 111  BILITOT 1.8*  PROT 6.9  ALBUMIN 2.7*   No results for input(s): LIPASE, AMYLASE in the last 168 hours. No results for input(s): AMMONIA in the last 168 hours. CBC:  Recent Labs Lab 04/13/14 1418  WBC 5.3  NEUTROABS 3.3  HGB 11.0*  HCT 34.9*  MCV 86.4  PLT 173   Cardiac Enzymes: No results for input(s): CKTOTAL, CKMB, CKMBINDEX, TROPONINI in the last 168 hours. BNP: Invalid input(s): POCBNP CBG: No results for input(s): GLUCAP in the last 168 hours.  Radiological Exams on Admission: Dg Chest 2 View  04/13/2014   CLINICAL DATA:  74 year old female with shortness of Breath. Initial encounter.  EXAM: CHEST  2 VIEW  COMPARISON:  None.  FINDINGS: Cardiomegaly. Moderate right and small left pleural effusions. Associated bibasilar opacification. No pneumothorax. No acute pulmonary edema. Leftward deviation of the trachea at the thoracic inlet suggesting right thyroid goiter. Small surgical clips at the level of the left thyroid bed. No acute osseous abnormality identified.  IMPRESSION: 1. Moderate to severe cardiomegaly, consider pericardial effusion. 2. Moderate  right and small left pleural effusions with associated lung base collapse or consolidation. 3. 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 Possible previous left thyroidectomy.   Electronically Signed   By: Augusto Gamble M.D.   On: 04/13/2014 13:11    EKG: sinus tach long qt , reviewed. Rate of 120/min  Time spent: 55 min  Leslie Kline [facility] Pager [phone]  If 7PM-7AM, please contact night-coverage [email or web address] Password [facility] 04/15/2014, 11:20 AM

## 2014-04-15 NOTE — Progress Notes (Signed)
Patient states that she can't breathe. Oxygen saturation on room air is 98%. Applied 1 liter of oxygen to patient via nasal cannula for comfort. Patient stated she felt like she was getting enough air when asked. Also patient given PRN dose of Ativan per order to help patient to relax to be able to breath better for she appeared to be anxious. Will continue to monitor patient to end of shift.

## 2014-04-15 NOTE — Progress Notes (Signed)
Patient: Leslie Kline / Admit Date: 04/13/2014 / Date of Encounter: 04/15/2014, 8:46 AM   Subjective: Continued dyspnea, orthopnea. She does not think she can lay flat. LEE has improved. She notes she used to weigh 280lbs but has steadily been losing weight over the last few years.   Objective: Telemetry: sinus tach low 100-120s; 8 beat WCT yesterday Physical Exam: Blood pressure 123/95, pulse 107, temperature 98 F (36.7 C), temperature source Oral, resp. rate 16, height  (1.676 m), weight 166 lb 6.4 oz (75.479 kg), SpO2 98 %. General: Well developed F in no acute distress. Head: Normocephalic, atraumatic, sclera non-icteric, no xanthomas, nares are without discharge. Facial wasting noted with significant R sided thyroid fullness. Neck: JVP mildly elevated. Lungs: Diminished at bases but otherwise clear bilaterally to auscultation without wheezes, rales, or rhonchi. Breathing is unlabored. Heart: Reg rhythm, tachycardic S1 S2 without murmurs, rubs, or gallops.  Abdomen: Soft, non-tender, non-distended with normoactive bowel sounds. No rebound/guarding. Extremities: No clubbing or cyanosis. 1+ BLE edema. Distal pedal pulses are equal. Neuro: Alert and oriented X 3. Moves all extremities spontaneously. Psych:  Responds to questions appropriately with a normal affect.   Intake/Output Summary (Last 24 hours) at 04/15/14 0846 Last data filed at 04/15/14 0802  Gross per 24 hour  Intake   1283 ml  Output   1650 ml  Net   -367 ml    Inpatient Medications:  . aspirin EC  81 mg Oral Daily  . furosemide  40 mg Intravenous BID  . heparin  5,000 Units Subcutaneous 3 times per day  . lisinopril  10 mg Oral Daily  . potassium chloride  40 mEq Oral TID  . sodium chloride  3 mL Intravenous Q12H   Infusions:    Labs:  Recent Labs  04/13/14 2032 04/14/14 0440 04/14/14 1447 04/15/14 0340  NA  --  144  --  147*  K  --  2.5* 4.0 3.9  CL  --  103  --  107  CO2  --  32  --  30    GLUCOSE  --  89  --  101*  BUN  --  9  --  11  CREATININE  --  0.35*  --  0.47*  CALCIUM  --  7.5*  --  7.8*  MG 1.5  --   --   --     Recent Labs  04/13/14 1418  AST 25  ALT 8  ALKPHOS 111  BILITOT 1.8*  PROT 6.9  ALBUMIN 2.7*    Recent Labs  04/13/14 1418  WBC 5.3  NEUTROABS 3.3  HGB 11.0*  HCT 34.9*  MCV 86.4  PLT 173   Radiology/Studies:  Dg Chest 2 View  04/13/2014   CLINICAL DATA:  74 year old female with shortness of Breath. Initial encounter.  EXAM: CHEST  2 VIEW  COMPARISON:  None.  FINDINGS: Cardiomegaly. Moderate right and small left pleural effusions. Associated bibasilar opacification. No pneumothorax. No acute pulmonary edema. Leftward deviation of the trachea at the thoracic inlet suggesting right thyroid goiter. Small surgical clips at the level of the left thyroid bed. No acute osseous abnormality identified.  IMPRESSION: 1. Moderate to severe cardiomegaly, consider pericardial effusion. 2. Moderate right and small left pleural effusions with associated lung base collapse or consolidation. 3. 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 Possible previous left thyroidectomy.   Electronically Signed   By: Augusto Gamble M.D.   On: 04/13/2014 13:11     Assessment and Plan  73  y/o F with only h/o L-sided thyroidectomy presented with acute systolic CHF (EF 56-43%), hypokalemia, hypoalbuminemia, mild anemia, sinus tach and prolonged QTc. Found to likely be hyperthyroid with concern for high-output CHF.  1. Acute systolic CHF with biventricular dysfunction - EF 20-25%, severely dilated LV, mod-severely reduced RV systolic function mild-mod MR, mod TR/PR - wt - 5lbs with diuresis but still volume overloaded - continue Lasix and KCl another day - as I think tachycardia is more from hyperthyroidism rather than acute decompensation, will start BB and reduce dose of ACEI to accomodate for blood pressure - she is apprehensive about cath today and does not think she will be able to lay  flat for more than a few minutes due to orthopnea - will follow over the weekend for timing - if MD agrees, will need diet order  2. Hyperthyroidism (remote h/o L thyroidectomy) with right sided thyroid fullness on exam - TSH severely suppressed and free T4 elevated - will consult IM since endocrinology does not come to the hospital  3. Prolonged QTc and 8 beat WCT in setting of hypokalemia, hypomagnesemia - recheck EKG today for QT - K improved, Mg 1.5 two days ago - have asked lab to add on this morning to determine if we need to replete - add BB as above  4. HTN, see above  6. Mild anemia - appreciate IM input with this as well  Signed, Ronie Spies PA-C

## 2014-04-15 NOTE — Plan of Care (Signed)
Problem: Phase I Progression Outcomes Goal: EF % per last Echo/documented,Core Reminder form on chart Outcome: Completed/Met Date Met:  04/15/14 Echo performed on 04/13/2014 EF% - 20-25 per Echo done on 04/13/2014

## 2014-04-16 ENCOUNTER — Inpatient Hospital Stay (HOSPITAL_COMMUNITY): Payer: Medicare HMO

## 2014-04-16 DIAGNOSIS — M7989 Other specified soft tissue disorders: Secondary | ICD-10-CM

## 2014-04-16 DIAGNOSIS — E870 Hyperosmolality and hypernatremia: Secondary | ICD-10-CM | POA: Diagnosis present

## 2014-04-16 DIAGNOSIS — I10 Essential (primary) hypertension: Secondary | ICD-10-CM | POA: Diagnosis present

## 2014-04-16 DIAGNOSIS — R601 Generalized edema: Secondary | ICD-10-CM | POA: Diagnosis present

## 2014-04-16 LAB — BASIC METABOLIC PANEL
Anion gap: 14 (ref 5–15)
BUN: 13 mg/dL (ref 6–23)
CHLORIDE: 105 mmol/L (ref 96–112)
CO2: 24 mmol/L (ref 19–32)
CREATININE: 0.44 mg/dL — AB (ref 0.50–1.10)
Calcium: 8 mg/dL — ABNORMAL LOW (ref 8.4–10.5)
GFR calc Af Amer: >90 mL/min (ref >90)
GFR calc non Af Amer: >90 mL/min (ref >90)
GLUCOSE: 102 mg/dL — AB (ref 70–99)
Potassium: 4 mmol/L (ref 3.5–5.1)
SODIUM: 143 mmol/L (ref 135–145)

## 2014-04-16 LAB — T3: T3 TOTAL: 405 ng/dL — AB (ref 71–180)

## 2014-04-16 LAB — CBC
HCT: 34.5 % — ABNORMAL LOW (ref 36.0–46.0)
Hemoglobin: 10.8 g/dL — ABNORMAL LOW (ref 12.0–15.0)
MCH: 26.6 pg (ref 26.0–34.0)
MCHC: 31.3 g/dL (ref 30.0–36.0)
MCV: 85 fL (ref 78.0–100.0)
Platelets: 176 10*3/uL (ref 150–400)
RBC: 4.06 MIL/uL (ref 3.87–5.11)
RDW: 16.8 % — AB (ref 11.5–15.5)
WBC: 3.8 10*3/uL — ABNORMAL LOW (ref 4.0–10.5)

## 2014-04-16 LAB — CLOSTRIDIUM DIFFICILE BY PCR: Toxigenic C. Difficile by PCR: NEGATIVE

## 2014-04-16 LAB — MAGNESIUM: MAGNESIUM: 1.9 mg/dL (ref 1.5–2.5)

## 2014-04-16 MED ORDER — CALCIUM POLYCARBOPHIL 625 MG PO TABS
625.0000 mg | ORAL_TABLET | ORAL | Status: DC | PRN
  Filled 2014-04-16: qty 1

## 2014-04-16 MED ORDER — FUROSEMIDE 10 MG/ML IJ SOLN
60.0000 mg | Freq: Two times a day (BID) | INTRAMUSCULAR | Status: DC
  Administered 2014-04-16 – 2014-04-20 (×7): 60 mg via INTRAVENOUS
  Filled 2014-04-16 (×9): qty 6

## 2014-04-16 NOTE — Progress Notes (Signed)
Patient: Leslie Kline / Admit Date: 04/13/2014 / Date of Encounter: 04/16/2014, 8:11 AM   Subjective: Weight is down 1 lb overnight - recorded net negative almost 1L. Methimazole started yesterday. Appreciate additional medicine recommendations - given the severity of her heart failure, felt that starting therapy for hyperthyroidism took precedence over waiting off medication for a RAIU scan. Agree with thyroid US to r/o multinodular goiter. Creatinine stable. Magnesium up to 1.9 today. BNP was 1437.   Objective: Telemetry: sinus tach low 100's, no NSVT overnight Physical Exam: Blood pressure 94/61, pulse 100, temperature 97.6 F (36.4 C), temperature source Oral, resp. rate 18, height 5\' 6"  (1.676 m), weight 165 lb 9.2 oz (75.103 kg), SpO2 98 %. General: Well developed F in no acute distress. Head: Normocephalic, atraumatic, sclera non-icteric, no xanthomas, nares are without discharge. Facial wasting noted with significant R sided thyroid fullness. Neck: JVP mildly elevated. Lungs: Diminished at bases but otherwise clear bilaterally to auscultation without wheezes, rales, or rhonchi. Breathing is unlabored. Heart: Reg rhythm, tachycardic S1 S2 without murmurs, rubs, or gallops.  Abdomen: Soft, non-tender, non-distended with normoactive bowel sounds. No rebound/guarding. Extremities: No clubbing or cyanosis. 1+ BLE edema. Distal pedal pulses are equal. Neuro: Alert and oriented X 3. Moves all extremities spontaneously. Psych:  Responds to questions appropriately with a normal affect.   Intake/Output Summary (Last 24 hours) at 04/16/14 0811 Last data filed at 04/16/14 0522  Gross per 24 hour  Intake   1260 ml  Output   1300 ml  Net    -40 ml    Inpatient Medications:  . aspirin EC  81 mg Oral Daily  . carvedilol  12.5 mg Oral BID WC  . furosemide  40 mg Intravenous BID  . heparin  5,000 Units Subcutaneous 3 times per day  . lisinopril  2.5 mg Oral Daily  . magnesium oxide  400 mg  Oral Daily  . methimazole  10 mg Oral TID  . potassium chloride  40 mEq Oral TID  . sodium chloride  3 mL Intravenous Q12H   Infusions:    Labs:  Recent Labs  04/15/14 0340 04/15/14 0402 04/16/14 0303  NA 147*  --  143  K 3.9  --  4.0  CL 107  --  105  CO2 30  --  24  GLUCOSE 101*  --  102*  BUN 11  --  13  CREATININE 0.47*  --  0.44*  CALCIUM 7.8*  --  8.0*  MG  --  1.4* 1.9    Recent Labs  04/13/14 1418  AST 25  ALT 8  ALKPHOS 111  BILITOT 1.8*  PROT 6.9  ALBUMIN 2.7*    Recent Labs  04/13/14 1418 04/16/14 0303  WBC 5.3 3.8*  NEUTROABS 3.3  --   HGB 11.0* 10.8*  HCT 34.9* 34.5*  MCV 86.4 85.0  PLT 173 176   Radiology/Studies:  Dg Chest 2 View  04/13/2014   CLINICAL DATA:  74 year old female with shortness of Breath. Initial encounter.  EXAM: CHEST  2 VIEW  COMPARISON:  None.  FINDINGS: Cardiomegaly. Moderate right and small left pleural effusions. Associated bibasilar opacification. No pneumothorax. No acute pulmonary edema. Leftward deviation of the trachea at the thoracic inlet suggesting right thyroid goiter. Small surgical clips at the level of the left thyroid bed. No acute osseous abnormality identified.  IMPRESSION: 1. Moderate to severe cardiomegaly, consider pericardial effusion. 2. Moderate right and small left pleural effusions with associated lung base collapse or  consolidation. 3. 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 Possible previous left thyroidectomy.   Electronically Signed   By: Augusto Gamble M.D.   On: 04/13/2014 13:11     Assessment and Plan  74 y/o F with only h/o L-sided thyroidectomy presented with acute systolic CHF (EF 16-10%), hypokalemia, hypoalbuminemia, mild anemia, sinus tach and prolonged QTc. Found to likely be hyperthyroid with concern for high-output CHF.  1. Acute systolic CHF with biventricular dysfunction - EF 20-25%, severely dilated LV, mod-severely reduced RV systolic function mild-mod MR, mod TR/PR - wt - 6lbs with diuresis  but still volume overloaded - continue IV diuresis today, increase lasix to 60 mg BID, watch oral intake which was 1.2L yesterday - continue carvedilol - no room to uptitrate due to BP - Plan for [facility] on Monday - will put her on the add-on board  2. Hyperthyroidism (remote h/o L thyroidectomy) with right sided thyroid fullness on exam - TSH severely suppressed and free T4 elevated - methimazole 10 mg TID started - appreciate medicine consult, thyroid US ordered  3. Prolonged QTc and 8 beat WCT in setting of hypokalemia, hypomagnesemia - QTc improved, electrolytes repleted  Chrystie Nose, MD, [facility] Attending Cardiologist [facility]

## 2014-04-16 NOTE — Progress Notes (Signed)
Family visitors instructed and educated on c-diff precautions.

## 2014-04-16 NOTE — Progress Notes (Signed)
VASCULAR LAB PRELIMINARY  PRELIMINARY  PRELIMINARY  PRELIMINARY  Right upper extremity venous duplex  completed.    Preliminary report:  Right:  No evidence of DVT or superficial thrombosis.    Tyja Gortney, RVT 04/16/2014, 11:15 AM

## 2014-04-16 NOTE — Consult Note (Signed)
[facility] Medical Consultation  Trana Strid VOZ:[id number] DOB: 11/30/1940 DOA: 04/13/2014 PCP: No primary care provider on file.   Requesting physician: Dr. Bo Merino Date of consultation: 04/15/2014 Reason for consultation: Hyperthyroidism  Impression/Recommendations Active Problems:   Acute systolic CHF (congestive heart failure)   Dyspnea   Biventricular heart failure   Hyperthyroidism   Thyroid goiter   Prolonged QT interval   Hypomagnesemia   Hypokalemia   Anemia   Primary Hyperthyroidism: -Reviewed all vitals and lab findings  -Elevated free T4 at 5.36 ng/dL, and suppressed TSH at 0.046uIU/ml -Free T3 elevated at 405ng/dL -Cardiology started methimazole at 10 mg TID  -Patient will require radio iodine uptake scan. Would need to wait 6 tto 8 weeks post IV contrast before performing radio iodine uptake scan.  -Spoke with Dr. Zoila Shutter (cardiology) and patient scheduled for cardiac catheterization on Monday 25 January.  -Upon discharge recommend patient be referred to endocrinologist to complete workup of large goiter. Will eventually require biopsy.  Cardiomyopathy/Acute systolic heart failure -Per cardiology will continue to diuresis patient aggressively.  -Lasix 60 mg BID -Patient scheduled for cardiac catheterization on 25 January (Monday)  Anasarca  -Patient unsure of her dry weight, however states swelling in arms and legs has decreased with diuresis. .   Hypertension -Continue Coreg 12.5 mg  BID per cardiology.   Mild hypernatremia -Reviewed all laboratory findings.  -Most likely secondary to patient's decompensated heart failure improving with patient's diuresis.    Hypokalemia -Potassium goal>4 -Monitor closely and replete as appropriate  Hypomagnesemia:  -Magnesium goal >2 -Monitor closely and replete as appropriate  I will sign off for now, please reconsult if you feel appropriate. Thank you for this consultation.      Chief  Complaint: DOE  HPI:  74 year old BF PMHx hypertension, systolic CHF, hypokalemia, SOB, thyroid disease, s/p left thyroidectomy 15 years ago by Dr Nancy Marus, comes in for worsening sob on exertion. She also reports unintentional weight loss of 260 lbs to 165 in the last one year. She reports fatigue and palpitations occasionally. She was admitted to cardiology service for heart failure and was started on IV lasix for diuresis. Her thyroid panel revealed suppressed TSH and elevated free t4 and we were consulted for further evaluation. cxr showed right thyroid goiter.    Review of Systems:  The patient denies anorexia, fever, vision loss, decreased hearing, hoarseness, chest pain, syncope, balance deficits, hemoptysis, abdominal pain, melena, hematochezia, severe indigestion/heartburn, hematuria, incontinence, genital sores, muscle weakness, suspicious skin lesions, transient blindness, difficulty walking, depression, abnormal bleeding, enlarged lymph nodes, angioedema, and breast masses.    Procedure 1/21 echocardiogram;- Left ventricle: moderately dilated.-LVEF= 20% to 25%. - Mitral valve: mild to moderate regurgitation. - Left atrium: severely dilated. - Right ventricle: moderately dilated.  - Right atrium: moderately dilated. - Tricuspid valve:  moderate regurgitation. - Pulmonic valve: moderate regurgitation. - Pulmonary arteries: PA peak pressure: 54 mm Hg (S).     Past Medical History  Diagnosis Date  . Hypertension   . CHF (congestive heart failure)   . Hypokalemia 04/13/2014  . Shortness of breath dyspnea    Past Surgical History  Procedure Laterality Date  . Tubal ligation    . Goiter    . Tonsillectomy    . Abdominal hysterectomy     Social History:  reports that she has never smoked. She has never used smokeless tobacco. She reports that she does not drink alcohol or use illicit drugs.  No Known Allergies Family History  Problem Relation Age of Onset  . Diabetes  Maternal Grandfather     Prior to Admission medications   Medication Sig Start Date End Date Taking? Authorizing Provider  aspirin 325 MG EC tablet Take 325 mg by mouth every 6 (six) hours as needed for pain.   Yes Historical Provider, MD  aspirin-sod bicarb-citric acid (ALKA-SELTZER) 325 MG TBEF tablet Take 325 mg by mouth every 6 (six) hours as needed.   Yes Historical Provider, MD   Physical Exam: Blood pressure 94/61, pulse 100, temperature 97.6 F (36.4 C), temperature source Oral, resp. rate 18, height  (1.676 m), weight 75.103 kg (165 lb 9.2 oz), SpO2 98 %. Filed Vitals:   04/15/14 1655 04/15/14 2127 04/16/14 0000 04/16/14 0523  BP: 106/76 99/67  94/61  Pulse: 102 102  100  Temp:  97.4 F (36.3 C)  97.6 F (36.4 C)  TempSrc:  Oral  Oral  Resp:  Height:      Weight:    75.103 kg (165 lb 9.2 oz)  SpO2:  95%  98%     General:  A/O 0, NAD,  Eyes: Pupils equal round react to light and accommodation  Neck: Enlarged thyroid gland Rt > Lt , nontender,  Cardiovascular: Regular rhythm and rate, negative murmurs rubs gallops, normal S1/S2  Respiratory: Clear to auscultation bilateral  Abdomen: Soft, nontender, nondistended, plus bowel sound  Skin: Negative lacerations, lesions  Musculoskeletal: Anasarca    Labs on Admission:  Basic Metabolic Panel:  Recent Labs Lab 04/13/14 1418 04/13/14 2032 04/14/14 0440 04/14/14 1447 04/15/14 0340 04/15/14 0402 04/16/14 0303  NA 146*  --  144  --  147*  --  143  K 3.2*  --  2.5* 4.0 3.9  --  4.0  CL 103  --  103  --  107  --  105  CO2 30  --  32  --  30  --  24  GLUCOSE 74  --  89  --  101*  --  102*  BUN 9  --  9  --  11  --  13  CREATININE 0.33*  --  0.35*  --  0.47*  --  0.44*  CALCIUM 7.9*  --  7.5*  --  7.8*  --  8.0*  MG  --  1.5  --   --   --  1.4* 1.9   Liver Function Tests:  Recent Labs Lab 04/13/14 1418  AST 25  ALT 8  ALKPHOS 111  BILITOT 1.8*  PROT 6.9  ALBUMIN 2.7*   No results  for input(s): LIPASE, AMYLASE in the last 168 hours. No results for input(s): AMMONIA in the last 168 hours. CBC:  Recent Labs Lab 04/13/14 1418 04/16/14 0303  WBC 5.3 3.8*  NEUTROABS 3.3  --   HGB 11.0* 10.8*  HCT 34.9* 34.5*  MCV 86.4 85.0  PLT 173 176   Cardiac Enzymes: No results for input(s): CKTOTAL, CKMB, CKMBINDEX, TROPONINI in the last 168 hours. BNP: Invalid input(s): POCBNP CBG: No results for input(s): GLUCAP in the last 168 hours.  Radiological Exams on Admission: No results found.  EKG:   Time spent: 35 min  Drema Dallas [facility] Pager [phone]  If 7PM-7AM, please contact night-coverage [email or web address] Password [facility] 04/16/2014, 9:07 AM

## 2014-04-16 NOTE — Progress Notes (Signed)
Patient stated that she's had approximately 5-6 loose stools. Notified NP on call, placed on enteric precautions. Orders received, will continue to monitor accordingly.

## 2014-04-17 DIAGNOSIS — E876 Hypokalemia: Secondary | ICD-10-CM

## 2014-04-17 DIAGNOSIS — E041 Nontoxic single thyroid nodule: Secondary | ICD-10-CM

## 2014-04-17 LAB — CBC
HEMATOCRIT: 36.2 % (ref 36.0–46.0)
HEMOGLOBIN: 11.2 g/dL — AB (ref 12.0–15.0)
MCH: 26.2 pg (ref 26.0–34.0)
MCHC: 30.9 g/dL (ref 30.0–36.0)
MCV: 84.6 fL (ref 78.0–100.0)
Platelets: 177 10*3/uL (ref 150–400)
RBC: 4.28 MIL/uL (ref 3.87–5.11)
RDW: 16.7 % — AB (ref 11.5–15.5)
WBC: 4.1 10*3/uL (ref 4.0–10.5)

## 2014-04-17 MED ORDER — LOPERAMIDE HCL 2 MG PO CAPS
2.0000 mg | ORAL_CAPSULE | ORAL | Status: DC | PRN
  Administered 2014-04-17 – 2014-04-18 (×3): 2 mg via ORAL
  Filled 2014-04-17 (×3): qty 1

## 2014-04-17 MED ORDER — SODIUM CHLORIDE 0.9 % IV SOLN: 250.0000 mL | INTRAVENOUS | Status: DC | PRN

## 2014-04-17 MED ORDER — LOPERAMIDE HCL 2 MG PO CAPS
4.0000 mg | ORAL_CAPSULE | Freq: Once | ORAL | Status: AC
  Administered 2014-04-17: 4 mg via ORAL
  Filled 2014-04-17: qty 2

## 2014-04-17 MED ORDER — ZOLPIDEM TARTRATE 5 MG PO TABS
5.0000 mg | ORAL_TABLET | Freq: Once | ORAL | Status: AC
  Administered 2014-04-17: 5 mg via ORAL
  Filled 2014-04-17: qty 1

## 2014-04-17 MED ORDER — SODIUM CHLORIDE 0.9 % IV SOLN
INTRAVENOUS | Status: DC
  Administered 2014-04-18: 04:00:00 via INTRAVENOUS

## 2014-04-17 MED ORDER — SODIUM CHLORIDE 0.9 % IJ SOLN
3.0000 mL | Freq: Two times a day (BID) | INTRAMUSCULAR | Status: DC
  Administered 2014-04-17: 3 mL via INTRAVENOUS

## 2014-04-17 MED ORDER — SODIUM CHLORIDE 0.9 % IJ SOLN: 3.0000 mL | INTRAMUSCULAR | Status: DC | PRN

## 2014-04-17 NOTE — Progress Notes (Signed)
Patient: Leslie Kline / Admit Date: 04/13/2014 / Date of Encounter: 04/17/2014, 10:16 AM   Subjective: Recorded net negative overnight - weight is down an additional 3 lbs. Plan for [facility] tomorrow.  Thyroid ultrasound shows a large nodule on the right, s/p left thyroidectomy. Needle biopsy is recommended.  Objective: Telemetry: sinus tach low 100's, no NSVT overnight Physical Exam: Blood pressure 113/70, pulse 93, temperature 97.4 F (36.3 C), temperature source Oral, resp. rate 20, height 5\' 6"  (1.676 m), weight 162 lb 0.6 oz (73.5 kg), SpO2 96 %. General: Well developed F in no acute distress. Head: Normocephalic, atraumatic, sclera non-icteric, no xanthomas, nares are without discharge. Facial wasting noted with significant R sided thyroid fullness. Neck: JVP appears flat Lungs: Diminished at bases but otherwise clear bilaterally to auscultation without wheezes, rales, or rhonchi. Breathing is unlabored. Heart: Reg rhythm, tachycardic S1 S2 without murmurs, rubs, or gallops.  Abdomen: Soft, non-tender, non-distended with normoactive bowel sounds. No rebound/guarding. Extremities: No clubbing or cyanosis. 1+ BLE edema. Distal pedal pulses are equal. Neuro: Alert and oriented X 3. Moves all extremities spontaneously. Psych:  Responds to questions appropriately with a normal affect.   Intake/Output Summary (Last 24 hours) at 04/17/14 1016 Last data filed at 04/17/14 1015  Gross per 24 hour  Intake    760 ml  Output    201 ml  Net    559 ml    Inpatient Medications:  . aspirin EC  81 mg Oral Daily  . carvedilol  12.5 mg Oral BID WC  . furosemide  60 mg Intravenous BID  . heparin  5,000 Units Subcutaneous 3 times per day  . lisinopril  2.5 mg Oral Daily  . magnesium oxide  400 mg Oral Daily  . methimazole  10 mg Oral TID  . potassium chloride  40 mEq Oral TID  . sodium chloride  3 mL Intravenous Q12H   Infusions:    Labs:  Recent Labs  04/15/14 0340 04/15/14 0402  04/16/14 0303  NA 147*  --  143  K 3.9  --  4.0  CL 107  --  105  CO2 30  --  24  GLUCOSE 101*  --  102*  BUN 11  --  13  CREATININE 0.47*  --  0.44*  CALCIUM 7.8*  --  8.0*  MG  --  1.4* 1.9   No results for input(s): AST, ALT, ALKPHOS, BILITOT, PROT, ALBUMIN in the last 72 hours.  Recent Labs  04/16/14 0303 04/17/14 0419  WBC 3.8* 4.1  HGB 10.8* 11.2*  HCT 34.5* 36.2  MCV 85.0 84.6  PLT 176 177   Radiology/Studies:  Dg Chest 2 View  04/13/2014   CLINICAL DATA:  74 year old female with shortness of Breath. Initial encounter.  EXAM: CHEST  2 VIEW  COMPARISON:  None.  FINDINGS: Cardiomegaly. Moderate right and small left pleural effusions. Associated bibasilar opacification. No pneumothorax. No acute pulmonary edema. Leftward deviation of the trachea at the thoracic inlet suggesting right thyroid goiter. Small surgical clips at the level of the left thyroid bed. No acute osseous abnormality identified.  IMPRESSION: 1. Moderate to severe cardiomegaly, consider pericardial effusion. 2. Moderate right and small left pleural effusions with associated lung base collapse or consolidation. 3. 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 Possible previous left thyroidectomy.   Electronically Signed   By: Augusto Gamble M.D.   On: 04/13/2014 13:11   US Soft Tissue Head/neck  04/16/2014   CLINICAL DATA:  Evaluate goiter  EXAM: THYROID  ULTRASOUND  TECHNIQUE: Ultrasound examination of the thyroid gland and adjacent soft tissues was performed.  COMPARISON:  None.  FINDINGS: Right thyroid lobe  Measurements: 9.8 x 4.3 x 5.7 cm. Much of the entire right lobe is replaced by single large heterogeneous nodule. There are small areas of microcalcifications scattered throughout this nodule which is predominantly solid. Blood flow was identified throughout areas of the nodule.  Left thyroid lobe  Measurements: Previous left thyroidectomy. No residual or recurrent thyroid tissue noted within the left thyroid bed  Isthmus   Thickness: 0.5 cm.  No nodules visualized.  Lymphadenopathy  None visualized.  IMPRESSION: Large dominant nodule involves much of the right thyroid gland and contains areas of microcalcification. Findings meet consensus criteria for biopsy. Ultrasound-guided fine needle aspiration should be considered, as per the consensus statement: Management of Thyroid Nodules Detected at [place]: Society of Radiologists in Ultrasound Consensus Conference Statement. Radiology 2005; [id number].   Electronically Signed   By: Signa Kell M.D.   On: 04/16/2014 13:59     Assessment and Plan  74 y/o F with only h/o L-sided thyroidectomy presented with acute systolic CHF (EF 16-10%), hypokalemia, hypoalbuminemia, mild anemia, sinus tach and prolonged QTc. Found to likely be hyperthyroid with concern for high-output CHF.  1. Acute systolic CHF with biventricular dysfunction - EF 20-25%, severely dilated LV, mod-severely reduced RV systolic function mild-mod MR, mod TR/PR - wt - 9lbs with diuresis - continue IV diuresis today, increase lasix to 60 mg BID, watch oral intake which was 1.2L yesterday - continue carvedilol - no room to uptitrate due to BP - creatinine stable, continue IV lasix today - Plan for [facility] on Monday  2. Hyperthyroidism (remote h/o L thyroidectomy) with right sided thyroid fullness on exam - TSH severely suppressed and free T4 elevated - methimazole 10 mg TID started - appreciate medicine consult - thyroid ultrasound shows large right-sided nodule, will need FNA biopsy.  3. Prolonged QTc and 8 beat WCT in setting of hypokalemia, hypomagnesemia - QTc improved, electrolytes repleted  Chrystie Nose, MD, [facility] Attending Cardiologist [facility]

## 2014-04-18 ENCOUNTER — Encounter (HOSPITAL_COMMUNITY)
Admission: EM | Disposition: A | Discharge status: Home / Self Care | Payer: Self-pay | Source: Home / Self Care | Attending: Internal Medicine

## 2014-04-18 ENCOUNTER — Encounter (HOSPITAL_COMMUNITY): Payer: Self-pay | Admitting: Cardiology

## 2014-04-18 DIAGNOSIS — I420 Dilated cardiomyopathy: Secondary | ICD-10-CM

## 2014-04-18 DIAGNOSIS — I509 Heart failure, unspecified: Secondary | ICD-10-CM

## 2014-04-18 DIAGNOSIS — I4581 Long QT syndrome: Secondary | ICD-10-CM

## 2014-04-18 HISTORY — PX: LEFT AND RIGHT HEART CATHETERIZATION WITH CORONARY ANGIOGRAM: SHX5449

## 2014-04-18 LAB — CBC
HCT: 38.3 % (ref 36.0–46.0)
HEMOGLOBIN: 11.9 g/dL — AB (ref 12.0–15.0)
MCH: 26.4 pg (ref 26.0–34.0)
MCHC: 31.1 g/dL (ref 30.0–36.0)
MCV: 85.1 fL (ref 78.0–100.0)
Platelets: 174 10*3/uL (ref 150–400)
RBC: 4.5 MIL/uL (ref 3.87–5.11)
RDW: 16.6 % — AB (ref 11.5–15.5)
WBC: 4.1 10*3/uL (ref 4.0–10.5)

## 2014-04-18 LAB — BASIC METABOLIC PANEL
Anion gap: 6 (ref 5–15)
BUN: 22 mg/dL (ref 6–23)
CO2: 30 mmol/L (ref 19–32)
Calcium: 8 mg/dL — ABNORMAL LOW (ref 8.4–10.5)
Chloride: 107 mmol/L (ref 96–112)
Creatinine, Ser: 0.58 mg/dL (ref 0.50–1.10)
GFR calc Af Amer: >90 mL/min (ref >90)
GFR calc non Af Amer: 90 mL/min — ABNORMAL LOW (ref >90)
GLUCOSE: 98 mg/dL (ref 70–99)
Potassium: 5 mmol/L (ref 3.5–5.1)
Sodium: 143 mmol/L (ref 135–145)

## 2014-04-18 LAB — PROTIME-INR
INR: 1.45 (ref 0.00–1.49)
Prothrombin Time: 17.7 seconds — ABNORMAL HIGH (ref 11.6–15.2)

## 2014-04-18 LAB — BRAIN NATRIURETIC PEPTIDE: B Natriuretic Peptide: 1070.1 pg/mL — ABNORMAL HIGH (ref 0.0–100.0)

## 2014-04-18 SURGERY — LEFT AND RIGHT HEART CATHETERIZATION WITH CORONARY ANGIOGRAM
Anesthesia: LOCAL

## 2014-04-18 MED ORDER — LIDOCAINE HCL (PF) 1 % IJ SOLN
INTRAMUSCULAR | Status: AC
  Filled 2014-04-18: qty 30

## 2014-04-18 MED ORDER — SODIUM CHLORIDE 0.9 % IV SOLN
1.0000 mL/kg/h | INTRAVENOUS | Status: AC
  Administered 2014-04-18: 1 mL/kg/h via INTRAVENOUS

## 2014-04-18 MED ORDER — HEPARIN SODIUM (PORCINE) 5000 UNIT/ML IJ SOLN
5000.0000 [IU] | Freq: Three times a day (TID) | INTRAMUSCULAR | Status: DC
  Administered 2014-04-19 – 2014-04-21 (×3): 5000 [IU] via SUBCUTANEOUS
  Filled 2014-04-18 (×9): qty 1

## 2014-04-18 MED ORDER — HEPARIN (PORCINE) IN NACL 2-0.9 UNIT/ML-% IJ SOLN
INTRAMUSCULAR | Status: AC
Start: 2014-04-18 — End: 2014-04-18
  Filled 2014-04-18: qty 1000

## 2014-04-18 MED ORDER — FENTANYL CITRATE 0.05 MG/ML IJ SOLN
INTRAMUSCULAR | Status: AC
  Filled 2014-04-18: qty 2

## 2014-04-18 NOTE — Interval H&P Note (Signed)
History and Physical Interval Note:  04/18/2014 9:15 AM  Leslie Kline  has presented today for surgery, with the diagnosis of dyspnea  The various methods of treatment have been discussed with the patient and family. After consideration of risks, benefits and other options for treatment, the patient has consented to  Procedure(s): LEFT AND RIGHT HEART CATHETERIZATION WITH CORONARY ANGIOGRAM (N/A) as a surgical intervention .  The patient's history has been reviewed, patient examined, no change in status, stable for surgery.  I have reviewed the patient's chart and labs.  Questions were answered to the patient's satisfaction.   Cath Lab Visit (complete for each Cath Lab visit)  Clinical Evaluation Leading to the Procedure:   ACS: No.  Non-ACS:    Anginal Classification: CCS IV  Anti-ischemic medical therapy: Minimal Therapy (1 class of medications)  Non-Invasive Test Results: No non-invasive testing performed  Prior CABG: No previous CABG        Theron Arista [facility] 04/18/2014 9:15 AM

## 2014-04-18 NOTE — Progress Notes (Signed)
Site area: RFA/RFV Site Prior to Removal:  Level 0 Pressure Applied For: 40 min Manual:   yes Patient Status During Pull:  stable Post Pull Site:  Level  0 Post Pull Instructions Given:  yes Post Pull Pulses Present: palpable Dressing Applied:  clear Bedrest begins @ 1110

## 2014-04-18 NOTE — Progress Notes (Signed)
Pt back from cath lab with dsg. Rt. Groin site. D&I  Upon arrival.  Noted slight bleeding to site after pt got on bed pan at  Around 1142.  Notified Stacy in cath lab, who instructed someone will be up to check the site.  Also instructed  To apply pressure to site.  Pressure applied to Rt. Groin.  Arlys John from cath lab in to see pt at 1146.  Noted no further bleeding.  Instructed no further pressure needed to be applied.  If bleeding restart to give cath lab a call again.  Will continue to monitor.

## 2014-04-18 NOTE — Progress Notes (Signed)
Noted small  old bleeding at pt's Rt groin site from earlier spot.   Notified Arlys John in cath lab and he instructed he will send someone  to take a look at it.  Chip from cath lab came in and assessed site and note site ok.  No hematoma or further bleeding.  New dressing applied by Chip, cath lab nurse.  Will continue to monitor.

## 2014-04-18 NOTE — Plan of Care (Signed)
Problem: Phase I Progression Outcomes Goal: Vascular site scale level 0 - I Vascular Site Scale Level 0: No bruising/bleeding/hematoma Level I (Mild): Bruising/Ecchymosis, minimal bleeding/ooozing, palpable hematoma < 3 cm Level II (Moderate): Bleeding not affecting hemodynamic parameters, pseudoaneurysm, palpable hematoma > 3 cm Level III (Severe) Bleeding which affects hemodynamic parameters or retroperitoneal hemorrhage  Outcome: Progressing Level ll

## 2014-04-18 NOTE — Progress Notes (Signed)
[facility] Medical Consultation note   Leslie Kline ENI:[id number] DOB: 11/30/1940 DOA: 04/13/2014 PCP: No primary care provider on file.   Requesting physician: Dr. Donato Schultz  Date of consultation: 04/15/2014  Reason for consultation: Hyperthyroidism  Impression/Recommendations  Primary Hyperthyroidism: -Elevated free T4 at 5.36 ng/dL, suppressed TSH at 6.144RXV/QM, free T3 elevated at 405ng/dL -Cardiology started methimazole at 10 mg TID  -Patient will require radio iodine uptake scan. Would need to wait 6 to 8 weeks post IV contrast before performing radio iodine uptake scan, will need FNA Bx, can be done out-patient.  - I made her an appt with Dr Ernest Haber, endocrinology on 05/02/14 at 9:00 AM, in [facility]. patient can continue Tapazole 10 mg 3 times a day until endocrinology follow-up and then further dose adjustment by Dr. Elvera Lennox.   Cardiomyopathy/Acute systolic heart failure -Per cardiology will continue to diuresis patient aggressively.  -Lasix 60 mg BID, cardiac catheterization today  Anasarca  -Patient unsure of her dry weight, however states swelling in arms and legs has decreased with diuresis.    Hypertension -Continue Coreg 12.5 mg  BID per cardiology.   Mild hypernatremia- resolved -Most likely secondary to patient's decompensated heart failure improving with patient's diuresis, resolved.    Hypokalemia, hypomagnesemia - Resolved, replete as appropriate with diuresis   I will sign off, please reconsult if you have any questions. Patient will need prescription for Tapazole.  Subjective  Patient seen and examined, no significant complaints. No acute chest pain or shortness of breath, fevers or chills  Objective  BP 94/69 mmHg  Pulse 79  Temp(Src) 97 F (36.1 C) (Oral)  Resp 22  Ht 5\' 6"  (1.676 m)  Wt 74.4 kg (164 lb 0.4 oz)  BMI 26.49 kg/m2  SpO2 96%  Physical Exam:   General: Alert and oriented, NAD  Neck: Enlarged thyroid gland Rt >  Lt , nontender,  CVS: Regular rhythm and rate, negative murmurs rubs gallops, normal S1/S2  Respiratory: Clear to auscultation bilateral  Abdomen: Soft, nontender, nondistended, plus bowel sound  Extremities: No cyanosis, clubbing, 1+ bilateral LE edema    Labs on Admission:  Basic Metabolic Panel:  Recent Labs Lab 04/13/14 1418 04/13/14 2032 04/14/14 0440 04/14/14 1447 04/15/14 0340 04/15/14 0402 04/16/14 0303 04/18/14 0357  NA 146*  --  144  --  147*  --  143 143  K 3.2*  --  2.5* 4.0 3.9  --  4.0 5.0  CL 103  --  103  --  107  --  105 107  CO2 30  --  32  --  30  --  24 30  GLUCOSE 74  --  89  --  101*  --  102* 98  BUN 9  --  9  --  11  --  13 22  CREATININE 0.33*  --  0.35*  --  0.47*  --  0.44* 0.58  CALCIUM 7.9*  --  7.5*  --  7.8*  --  8.0* 8.0*  MG  --  1.5  --   --   --  1.4* 1.9  --    Liver Function Tests:  Recent Labs Lab 04/13/14 1418  AST 25  ALT 8  ALKPHOS 111  BILITOT 1.8*  PROT 6.9  ALBUMIN 2.7*   No results for input(s): LIPASE, AMYLASE in the last 168 hours. No results for input(s): AMMONIA in the last 168 hours. CBC:  Recent Labs Lab 04/13/14 1418 04/16/14 0303 04/17/14 0419 04/18/14 0867  WBC 5.3 3.8* 4.1 4.1  NEUTROABS 3.3  --   --   --   HGB 11.0* 10.8* 11.2* 11.9*  HCT 34.9* 34.5* 36.2 38.3  MCV 86.4 85.0 84.6 85.1  PLT 173 176 177 174   Cardiac Enzymes: No results for input(s): CKTOTAL, CKMB, CKMBINDEX, TROPONINI in the last 168 hours. BNP: Invalid input(s): POCBNP CBG: No results for input(s): GLUCAP in the last 168 hours.  Radiological Exams on Admission: US Soft Tissue Head/neck  04/16/2014   CLINICAL DATA:  Evaluate goiter  EXAM: THYROID ULTRASOUND  TECHNIQUE: Ultrasound examination of the thyroid gland and adjacent soft tissues was performed.  COMPARISON:  None.  FINDINGS: Right thyroid lobe  Measurements: 9.8 x 4.3 x 5.7 cm. Much of the entire right lobe is replaced by single large heterogeneous nodule. There are  small areas of microcalcifications scattered throughout this nodule which is predominantly solid. Blood flow was identified throughout areas of the nodule.  Left thyroid lobe  Measurements: Previous left thyroidectomy. No residual or recurrent thyroid tissue noted within the left thyroid bed  Isthmus  Thickness: 0.5 cm.  No nodules visualized.  Lymphadenopathy  None visualized.  IMPRESSION: Large dominant nodule involves much of the right thyroid gland and contains areas of microcalcification. Findings meet consensus criteria for biopsy. Ultrasound-guided fine needle aspiration should be considered, as per the consensus statement: Management of Thyroid Nodules Detected at [place]: Society of Radiologists in Ultrasound Consensus Conference Statement. Radiology 2005; [id number].   Electronically Signed   By: Signa Kell M.D.   On: 04/16/2014 13:59    Time  spent 25 minutes     Gunnard Dorrance M.D. [facility] 04/18/2014, 11:59 AM  Pager: [phone]

## 2014-04-18 NOTE — H&P (View-Only) (Signed)
Patient: Leslie Kline / Admit Date: 04/13/2014 / Date of Encounter: 04/17/2014, 10:16 AM   Subjective: Recorded net negative overnight - weight is down an additional 3 lbs. Plan for L/RHC tomorrow.  Thyroid ultrasound shows a large nodule on the right, s/p left thyroidectomy. Needle biopsy is recommended.  Objective: Telemetry: sinus tach low 100's, no NSVT overnight Physical Exam: Blood pressure 113/70, pulse 93, temperature 97.4 F (36.3 C), temperature source Oral, resp. rate 20, height 5' 6" (1.676 m), weight 162 lb 0.6 oz (73.5 kg), SpO2 96 %. General: Well developed F in no acute distress. Head: Normocephalic, atraumatic, sclera non-icteric, no xanthomas, nares are without discharge. Facial wasting noted with significant R sided thyroid fullness. Neck: JVP appears flat Lungs: Diminished at bases but otherwise clear bilaterally to auscultation without wheezes, rales, or rhonchi. Breathing is unlabored. Heart: Reg rhythm, tachycardic S1 S2 without murmurs, rubs, or gallops.  Abdomen: Soft, non-tender, non-distended with normoactive bowel sounds. No rebound/guarding. Extremities: No clubbing or cyanosis. 1+ BLE edema. Distal pedal pulses are equal. Neuro: Alert and oriented X 3. Moves all extremities spontaneously. Psych:  Responds to questions appropriately with a normal affect.   Intake/Output Summary (Last 24 hours) at 04/17/14 1016 Last data filed at 04/17/14 1015  Gross per 24 hour  Intake    760 ml  Output    201 ml  Net    559 ml    Inpatient Medications:  . aspirin EC  81 mg Oral Daily  . carvedilol  12.5 mg Oral BID WC  . furosemide  60 mg Intravenous BID  . heparin  5,000 Units Subcutaneous 3 times per day  . lisinopril  2.5 mg Oral Daily  . magnesium oxide  400 mg Oral Daily  . methimazole  10 mg Oral TID  . potassium chloride  40 mEq Oral TID  . sodium chloride  3 mL Intravenous Q12H   Infusions:    Labs:  Recent Labs  04/15/14 0340 04/15/14 0402  04/16/14 0303  NA 147*  --  143  K 3.9  --  4.0  CL 107  --  105  CO2 30  --  24  GLUCOSE 101*  --  102*  BUN 11  --  13  CREATININE 0.47*  --  0.44*  CALCIUM 7.8*  --  8.0*  MG  --  1.4* 1.9   No results for input(s): AST, ALT, ALKPHOS, BILITOT, PROT, ALBUMIN in the last 72 hours.  Recent Labs  04/16/14 0303 04/17/14 0419  WBC 3.8* 4.1  HGB 10.8* 11.2*  HCT 34.5* 36.2  MCV 85.0 84.6  PLT 176 177   Radiology/Studies:  Dg Chest 2 View  04/13/2014   CLINICAL DATA:  73-year-old female with shortness of Breath. Initial encounter.  EXAM: CHEST  2 VIEW  COMPARISON:  None.  FINDINGS: Cardiomegaly. Moderate right and small left pleural effusions. Associated bibasilar opacification. No pneumothorax. No acute pulmonary edema. Leftward deviation of the trachea at the thoracic inlet suggesting right thyroid goiter. Small surgical clips at the level of the left thyroid bed. No acute osseous abnormality identified.  IMPRESSION: 1. Moderate to severe cardiomegaly, consider pericardial effusion. 2. Moderate right and small left pleural effusions with associated lung base collapse or consolidation. 3. 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 Possible previous left thyroidectomy.   Electronically Signed   By: Lee  Hall M.D.   On: 04/13/2014 13:11   Us Soft Tissue Head/neck  04/16/2014   CLINICAL DATA:  Evaluate goiter  EXAM: THYROID   ULTRASOUND  TECHNIQUE: Ultrasound examination of the thyroid gland and adjacent soft tissues was performed.  COMPARISON:  None.  FINDINGS: Right thyroid lobe  Measurements: 9.8 x 4.3 x 5.7 cm. Much of the entire right lobe is replaced by single large heterogeneous nodule. There are small areas of microcalcifications scattered throughout this nodule which is predominantly solid. Blood flow was identified throughout areas of the nodule.  Left thyroid lobe  Measurements: Previous left thyroidectomy. No residual or recurrent thyroid tissue noted within the left thyroid bed  Isthmus   Thickness: 0.5 cm.  No nodules visualized.  Lymphadenopathy  None visualized.  IMPRESSION: Large dominant nodule involves much of the right thyroid gland and contains areas of microcalcification. Findings meet consensus criteria for biopsy. Ultrasound-guided fine needle aspiration should be considered, as per the consensus statement: Management of Thyroid Nodules Detected at [place]: Society of Radiologists in Ultrasound Consensus Conference Statement. Radiology 2005; [id number].   Electronically Signed   By: Taylor  Stroud M.D.   On: 04/16/2014 13:59     Assessment and Plan  73 y/o F with only h/o L-sided thyroidectomy presented with acute systolic CHF (EF 20-25%), hypokalemia, hypoalbuminemia, mild anemia, sinus tach and prolonged QTc. Found to likely be hyperthyroid with concern for high-output CHF.  1. Acute systolic CHF with biventricular dysfunction - EF 20-25%, severely dilated LV, mod-severely reduced RV systolic function mild-mod MR, mod TR/PR - wt - 9lbs with diuresis - continue IV diuresis today, increase lasix to 60 mg BID, watch oral intake which was 1.2L yesterday - continue carvedilol - no room to uptitrate due to BP - creatinine stable, continue IV lasix today - Plan for L/RHC on Monday  2. Hyperthyroidism (remote h/o L thyroidectomy) with right sided thyroid fullness on exam - TSH severely suppressed and free T4 elevated - methimazole 10 mg TID started - appreciate medicine consult - thyroid ultrasound shows large right-sided nodule, will need FNA biopsy.  3. Prolonged QTc and 8 beat WCT in setting of hypokalemia, hypomagnesemia - QTc improved, electrolytes repleted  Kenneth C. Hilty, MD, FACC Attending Cardiologist [facility]   

## 2014-04-18 NOTE — CV Procedure (Signed)
    Cardiac Catheterization Procedure Note  Name: Leslie Kline MRN: [id number] DOB: 11/30/1940  Procedure: Right Heart Cath, Left Heart Cath, Selective Coronary Angiography, LV angiography  Indication: 74 yo BF with new onset CHF. EF 20-25% by Echo.   Procedural Details: The right groin was prepped, draped, and anesthetized with 1% lidocaine. Using the modified Seldinger technique a 5 Fr sheath was placed in the right femoral artery and a 7 French sheath was placed in the right femoral vein. A Swan-Ganz catheter was used for the right heart catheterization. Standard protocol was followed for recording of right heart pressures and sampling of oxygen saturations. Fick cardiac output was calculated. Standard Judkins catheters were used for selective coronary angiography and left ventriculography. There were no immediate procedural complications. The patient was transferred to the post catheterization recovery area for further monitoring.  Procedural Findings: Hemodynamics RA 16/21 mean 17 mm Hg RV 43/17 mm Hg PA 43/22 mean 31 mm Hg PCWP 23/23 mean 20 mm Hg LV 104/20 mm Hg AO 104/71 mean 84 mm Hg  Oxygen saturations: PA 52% AO 95%  Cardiac Output (Fick) 3.5 L/min  Cardiac Index (Fick) 1.91 L/min/meter squared.   Coronary angiography: Coronary dominance: codominant  Left mainstem: Normal  Left anterior descending (LAD): Normal  Left circumflex (LCx): Normal  Right coronary artery (RCA): Normal  Left ventriculography: Not done  Final Conclusions:   1. Normal coronary anatomy 2. Moderate pulmonary HTN with elevated left ventricular filling pressures.  Recommendations: Medical management of CHF.   Elvie Palomo [place], MDFACC 04/18/2014, 10:01 AM

## 2014-04-18 NOTE — Progress Notes (Signed)
Hear bed alarm going off and NT found ptg on her way to the bath room.  Pt stated " I'M not able to use the bed pan, so I need to get up to the bathroom."   Instructed pt have to call for assistance as she is on bedrest post heart cath..  No further bleeding noted as from earlier today.  Assisted pt back to bed and bed alarm set back on and call bell at reach.  Instructed to use call be to prevent fall and bleed from cath site.  Pt verbalized understanding.  Will continue to monitor.  Leslie Kline, Charity fundraiser.

## 2014-04-19 ENCOUNTER — Telehealth: Payer: Self-pay | Admitting: Cardiovascular Disease

## 2014-04-19 DIAGNOSIS — I272 Pulmonary hypertension, unspecified: Secondary | ICD-10-CM | POA: Diagnosis present

## 2014-04-19 DIAGNOSIS — R0600 Dyspnea, unspecified: Secondary | ICD-10-CM

## 2014-04-19 DIAGNOSIS — Z0389 Encounter for observation for other suspected diseases and conditions ruled out: Secondary | ICD-10-CM

## 2014-04-19 DIAGNOSIS — R601 Generalized edema: Secondary | ICD-10-CM

## 2014-04-19 DIAGNOSIS — I10 Essential (primary) hypertension: Secondary | ICD-10-CM

## 2014-04-19 DIAGNOSIS — I428 Other cardiomyopathies: Secondary | ICD-10-CM

## 2014-04-19 DIAGNOSIS — IMO0001 Reserved for inherently not codable concepts without codable children: Secondary | ICD-10-CM

## 2014-04-19 DIAGNOSIS — I429 Cardiomyopathy, unspecified: Secondary | ICD-10-CM

## 2014-04-19 DIAGNOSIS — I270 Primary pulmonary hypertension: Secondary | ICD-10-CM

## 2014-04-19 LAB — POCT I-STAT 3, VENOUS BLOOD GAS (G3P V)
ACID-BASE EXCESS: 1 mmol/L (ref 0.0–2.0)
Bicarbonate: 25.7 mEq/L — ABNORMAL HIGH (ref 20.0–24.0)
O2 SAT: 52 %
PH VEN: 7.392 — AB (ref 7.250–7.300)
TCO2: 27 mmol/L (ref 0–100)
pCO2, Ven: 42.2 mmHg — ABNORMAL LOW (ref 45.0–50.0)
pO2, Ven: 28 mmHg — CL (ref 30.0–45.0)

## 2014-04-19 LAB — POCT I-STAT 3, ART BLOOD GAS (G3+)
Acid-base deficit: 1 mmol/L (ref 0.0–2.0)
BICARBONATE: 23.5 meq/L (ref 20.0–24.0)
O2 Saturation: 95 %
PO2 ART: 72 mmHg — AB (ref 80.0–100.0)
TCO2: 25 mmol/L (ref 0–100)
pCO2 arterial: 36.3 mmHg (ref 35.0–45.0)
pH, Arterial: 7.419 (ref 7.350–7.450)

## 2014-04-19 LAB — CBC
HEMATOCRIT: 37.2 % (ref 36.0–46.0)
Hemoglobin: 11.7 g/dL — ABNORMAL LOW (ref 12.0–15.0)
MCH: 27.1 pg (ref 26.0–34.0)
MCHC: 31.5 g/dL (ref 30.0–36.0)
MCV: 86.1 fL (ref 78.0–100.0)
Platelets: 141 10*3/uL — ABNORMAL LOW (ref 150–400)
RBC: 4.32 MIL/uL (ref 3.87–5.11)
RDW: 16.6 % — AB (ref 11.5–15.5)
WBC: 3.5 10*3/uL — ABNORMAL LOW (ref 4.0–10.5)

## 2014-04-19 MED ORDER — POTASSIUM CHLORIDE CRYS ER 20 MEQ PO TBCR
40.0000 meq | EXTENDED_RELEASE_TABLET | Freq: Every day | ORAL | Status: DC
  Administered 2014-04-20 – 2014-04-21 (×2): 40 meq via ORAL
  Filled 2014-04-19 (×3): qty 2

## 2014-04-19 NOTE — Telephone Encounter (Signed)
Shaquetta called in stating that the pt's daughter was concerned about mother's health and would [facility] to be brought up to speed about how her mother was doing. Pt's daughter is named [facility] and her number is [phone].  Thanks

## 2014-04-19 NOTE — Progress Notes (Signed)
Daughter Leslie Kline called to find out how her mom was doing. Also requested for MD to call. [facility] and spoke with nurse. Left message for Dr. Allyson Sabal to call daughter

## 2014-04-19 NOTE — Progress Notes (Signed)
No urine output recorded since 9AM. Hat placed in bathroom. Pt educated on importance of using hat to measure urine. Pt verbalized understanding and states she has been voided nonstop since she received lasix.

## 2014-04-19 NOTE — Progress Notes (Addendum)
Pt had cath yesterday to right groin. Pt with 1.5cm hematoma to right groin on assessment. Site is non-tender and not discolored. Pt refusing to stay in bed and use bedpan or bedside commode. Pt states she has to go in bathroom. Pt A/Ox4 and a standby assist. Pt educated on importance of bedrest at this time. Pt refusing. Cardiology paged. [phone]  Spoke with Dr. Zachery Conch on call. Ordered to hold heparin subQ tonight, pt able to ambulate to bathroom. Will cotninue to monitor. Huel Coventry, RN Hematoma was marked on patient. 9:06

## 2014-04-19 NOTE — Progress Notes (Signed)
Patient: Leslie Kline / Admit Date: 04/13/2014 / Date of Encounter: 04/19/2014, 9:07 AM   Subjective: Feeling better. Less SOB. LEE improving.  She's not sure of her baseline weight because she's been losing over the last few years steadily - used to weigh 280lbs.  Objective: Telemetry: NSR rate controlled, 5 beats WBC 2 days ago Physical Exam: Blood pressure 106/70, pulse 87, temperature 97.8 F (36.6 C), temperature source Oral, resp. rate 20, height  (1.676 m), weight 163 lb 5.8 oz (74.1 kg), SpO2 95 %. General: Well developed F in no acute distress. Head: Normocephalic, atraumatic, sclera non-icteric, no xanthomas, nares are without discharge. Facial wasting noted with significant R sided thyroid fullness. Neck: JVP mildly elevated. Lungs: Diminished at bases but otherwise clear bilaterally to auscultation without wheezes, rales, or rhonchi. Breathing is unlabored. Heart: Reg rhythm, tachycardic S1 S2 without murmurs, rubs, or gallops.  Abdomen: Soft, non-tender, non-distended with normoactive bowel sounds. No rebound/guarding. Extremities: No clubbing or cyanosis. Tr-1+ BLE edema. Distal pedal pulses are equal. Right groin site without ecchymosis, oozing, significant hematoma or bruit. Mild size discrepancy in UE with R>L, loose skin indicative of prior significant weight loss Neuro: Alert and oriented X 3. Moves all extremities spontaneously. Psych: Responds to questions appropriately with a normal affect.   Intake/Output Summary (Last 24 hours) at 04/19/14 0907 Last data filed at 04/19/14 0155  Gross per 24 hour  Intake 426.28 ml  Output    600 ml  Net -173.72 ml    Inpatient Medications:  . aspirin EC  81 mg Oral Daily  . carvedilol  12.5 mg Oral BID WC  . furosemide  60 mg Intravenous BID  . heparin  5,000 Units Subcutaneous 3 times per day  . lisinopril  2.5 mg Oral Daily  . magnesium oxide  400 mg Oral Daily  . methimazole  10 mg Oral TID  . potassium chloride   40 mEq Oral TID  . sodium chloride  3 mL Intravenous Q12H   Infusions:    Labs:  Recent Labs  04/18/14 0357  NA 143  K 5.0  CL 107  CO2 30  GLUCOSE 98  BUN 22  CREATININE 0.58  CALCIUM 8.0*   No results for input(s): AST, ALT, ALKPHOS, BILITOT, PROT, ALBUMIN in the last 72 hours.  Recent Labs  04/18/14 0357 04/19/14 0406  WBC 4.1 3.5*  HGB 11.9* 11.7*  HCT 38.3 37.2  MCV 85.1 86.1  PLT 174 141*   No results for input(s): CKTOTAL, CKMB, TROPONINI in the last 72 hours. Invalid input(s): POCBNP No results for input(s): HGBA1C in the last 72 hours.   Radiology/Studies:  Dg Chest 2 View  04/13/2014   CLINICAL DATA:  74 year old female with shortness of Breath. Initial encounter.  EXAM: CHEST  2 VIEW  COMPARISON:  None.  FINDINGS: Cardiomegaly. Moderate right and small left pleural effusions. Associated bibasilar opacification. No pneumothorax. No acute pulmonary edema. Leftward deviation of the trachea at the thoracic inlet suggesting right thyroid goiter. Small surgical clips at the level of the left thyroid bed. No acute osseous abnormality identified.  IMPRESSION: 1. Moderate to severe cardiomegaly, consider pericardial effusion. 2. Moderate right and small left pleural effusions with associated lung base collapse or consolidation. 3. 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 Possible previous left thyroidectomy.   Electronically Signed   By: Augusto Gamble M.D.   On: 04/13/2014 13:11   US Soft Tissue Head/neck  04/16/2014   CLINICAL DATA:  Evaluate goiter  EXAM: THYROID  ULTRASOUND  TECHNIQUE: Ultrasound examination of the thyroid gland and adjacent soft tissues was performed.  COMPARISON:  None.  FINDINGS: Right thyroid lobe  Measurements: 9.8 x 4.3 x 5.7 cm. Much of the entire right lobe is replaced by single large heterogeneous nodule. There are small areas of microcalcifications scattered throughout this nodule which is predominantly solid. Blood flow was identified throughout areas of the  nodule.  Left thyroid lobe  Measurements: Previous left thyroidectomy. No residual or recurrent thyroid tissue noted within the left thyroid bed  Isthmus  Thickness: 0.5 cm.  No nodules visualized.  Lymphadenopathy  None visualized.  IMPRESSION: Large dominant nodule involves much of the right thyroid gland and contains areas of microcalcification. Findings meet consensus criteria for biopsy. Ultrasound-guided fine needle aspiration should be considered, as per the consensus statement: Management of Thyroid Nodules Detected at [place]: Society of Radiologists in Ultrasound Consensus Conference Statement. Radiology 2005; [id number].   Electronically Signed   By: Signa Kell M.D.   On: 04/16/2014 13:59     Assessment and Plan  74 y/o F with only h/o L-sided thyroidectomy presented with acute systolic CHF (EF 65-78%), hypokalemia, hypoalbuminemia, mild anemia, sinus tach and prolonged QTc. Found to likely be hyperthyroid with concern for high-output CHF.  1. Acute systolic CHF with biventricular dysfunction  - due to NICM, suspected in setting of hyperthyroidism (LHC 04/18/14 - normal cors, mod pulm HTN with elevated LV fillings pressures) - EF 20-25%, severely dilated LV, mod-severely reduced RV systolic function mild-mod MR, mod TR/PR - continue BB, ACEI - Lasix increased yesterday, accuracy of I/O's in question. Dry weight unknown. Still with edema on board and elevated LV filling pressures by cath. Continue IV Lasix another day and consider changing to oral tomorrow. - K 5.0 --> already decreased this AM from TID, to starting tomorrow - obtain PT eval  2. New hyperthyroidism with right thyroid nodule (remote h/o L thyroidectomy) - TSH severely suppressed and free T4 elevated - methimazole 10 mg TID started - appreciate medicine consult - thyroid ultrasound shows large right-sided nodule, will need FNA biopsy - IM scheduled endocrine appt 05/02/14 at 9am to continue OP evaluation  3. Prolonged  QTc/minimal WCT in setting of hypokalemia, hypomagnesemia - recheck EKG for chart but reportedly improved - MagOx added for low Mg; on KCl  4. HTN, see above  6. Mild anemia - stable, now with mild leukopenia and thrombocytopenia to be followed 7. RUE swelling - neg ULE duplex  Signed, Dayna Dunn PA-C   Agree with findings by Ronie Spies PA-C  Admitted with CHF. 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 EF 20%. On approp meds. Diuresing. Feeling much better. Still has1-2+ periph edema. Agree with transitioning to PO lasix tomorrow. Follow lytes. May need to consider LifeVest for 3 months while on optimal med RX.    Runell Gess, M.D., FACP, [facility], [facility] [address] [place], [place]  [address]  [phone] 04/19/2014 11:20 AM

## 2014-04-19 NOTE — Evaluation (Signed)
Physical Therapy Evaluation Patient Details Name: Leslie Kline MRN: [id number] DOB: 11/30/1940 Today's Date: 04/19/2014   History of Present Illness  Pt adm with acute CHF. PMH - HTN  Clinical Impression  Pt doing well with mobility and no further PT needed.  Ready for dc from PT standpoint.      Follow Up Recommendations No PT follow up    Equipment Recommendations  None recommended by PT    Recommendations for Other Services       Precautions / Restrictions Precautions Precautions: None      Mobility  Bed Mobility Overal bed mobility: Independent                Transfers Overall transfer level: Independent                  Ambulation/Gait Ambulation/Gait assistance: Modified independent (Device/Increase time) Ambulation Distance (Feet): 100 Feet (100' x 1, 10' x 4 (going to bathroom))   Gait Pattern/deviations: Step-through pattern;Trunk flexed   Gait velocity interpretation: at or above normal speed for age/gender General Gait Details: Pt rushing with hurried gait but no unsteadiness or loss of balance. Pt with dyspnea 2/4 and SaO2 93% on RA after amb  Stairs            Wheelchair Mobility    Modified Rankin (Stroke Patients Only)       Balance Overall balance assessment: No apparent balance deficits (not formally assessed)                                           Pertinent Vitals/Pain Pain Assessment: No/denies pain    Home Living Family/patient expects to be discharged to:: Private residence               Home Equipment: None      Prior Function Level of Independence: Independent         Comments: works Education officer, environmental houses     Higher education careers adviser        Extremity/Trunk Assessment   Upper Extremity Assessment: Overall WFL for tasks assessed           Lower Extremity Assessment: Overall WFL for tasks assessed         Communication   Communication: No difficulties  Cognition  Arousal/Alertness: Awake/alert Behavior During Therapy: Impulsive Overall Cognitive Status: Within Functional Limits for tasks assessed                      General Comments      Exercises        Assessment/Plan    PT Assessment Patent does not need any further PT services  PT Diagnosis Difficulty walking   PT Problem List    PT Treatment Interventions     PT Goals (Current goals can be found in the Care Plan section) Acute Rehab PT Goals PT Goal Formulation: All assessment and education complete, DC therapy    Frequency     Barriers to discharge        Co-evaluation               End of Session   Activity Tolerance: Patient tolerated treatment well Patient left: in bed;with call bell/phone within reach;with bed alarm set;with family/visitor present (sitting EOB) Nurse Communication: Mobility status         Time: 1130-1145 PT Time Calculation (min) (ACUTE ONLY): 15  min   Charges:   PT Evaluation $Initial PT Evaluation Tier I: 1 Procedure     PT G Codes:        Nazia Rhines May 13, 2014, 1:43 PM  [facility] PT [phone]

## 2014-04-20 DIAGNOSIS — E870 Hyperosmolality and hypernatremia: Secondary | ICD-10-CM

## 2014-04-20 LAB — CBC
HCT: 32.6 % — ABNORMAL LOW (ref 36.0–46.0)
HEMOGLOBIN: 10.2 g/dL — AB (ref 12.0–15.0)
MCH: 26.6 pg (ref 26.0–34.0)
MCHC: 31.3 g/dL (ref 30.0–36.0)
MCV: 85.1 fL (ref 78.0–100.0)
PLATELETS: 151 10*3/uL (ref 150–400)
RBC: 3.83 MIL/uL — AB (ref 3.87–5.11)
RDW: 16.8 % — ABNORMAL HIGH (ref 11.5–15.5)
WBC: 3.2 10*3/uL — ABNORMAL LOW (ref 4.0–10.5)

## 2014-04-20 LAB — BASIC METABOLIC PANEL
BUN: 17 mg/dL (ref 6–23)
CHLORIDE: 105 mmol/L (ref 96–112)
CO2: 30 mmol/L (ref 19–32)
CREATININE: 0.58 mg/dL (ref 0.50–1.10)
Calcium: 7.3 mg/dL — ABNORMAL LOW (ref 8.4–10.5)
GFR calc Af Amer: >90 mL/min (ref >90)
GFR, EST NON AFRICAN AMERICAN: 90 mL/min — AB (ref >90)
Glucose, Bld: 111 mg/dL — ABNORMAL HIGH (ref 70–99)
Potassium: 3.8 mmol/L (ref 3.5–5.1)
Sodium: 137 mmol/L (ref 135–145)

## 2014-04-20 LAB — MAGNESIUM: Magnesium: 1.8 mg/dL (ref 1.5–2.5)

## 2014-04-20 MED ORDER — FUROSEMIDE 80 MG PO TABS
80.0000 mg | ORAL_TABLET | Freq: Every day | ORAL | Status: DC
Start: 2014-04-21 — End: 2014-04-21
  Administered 2014-04-21: 80 mg via ORAL
  Filled 2014-04-20: qty 1

## 2014-04-20 NOTE — Progress Notes (Signed)
Dr. Patty Sermons notified as patient has heparin ordered as VTE but small hematoma remains in right groin.  No orders to hold heparin currently.  Per Dr. Patty Sermons, hold heparin overnight and have primary MD address during day shift.  Will continue to monitor.

## 2014-04-20 NOTE — Progress Notes (Signed)
Pt high fall risk per assessment. Pt refusing bed alarm telling RN she will "not be in chains" and will "not be treated like an animal". Pt educated on floor high fall risk policy. Pt states she does not want a bed alarm on and will speak with someone else about it. Will continue to frequently round on patient and remind to call RN for assistance.

## 2014-04-20 NOTE — Progress Notes (Signed)
Pt with 10 beat run Vtach. Pt asymptomatic VSS. Pt has labs due in morning. Cardiology notified. Will continue to monitor. Huel Coventry, RN

## 2014-04-20 NOTE — Telephone Encounter (Signed)
Daughter is not listed on patient's dpr.  Patient is currently in hospital and any questions need to be addressed by rounding APP and/or MD.  I am unaware of Leslie Kline's call back number.

## 2014-04-20 NOTE — Progress Notes (Signed)
Patient Name: Leslie Kline Date of Encounter: 04/20/2014     Active Problems:   Acute systolic CHF (congestive heart failure)   Dyspnea   Hyperthyroidism   Thyroid goiter   Prolonged QT interval   Hypomagnesemia   Hypokalemia   Anemia   Anasarca   Essential hypertension   Hypernatremia   Normal coronary arteries   Nonischemic cardiomyopathy   Pulmonary HTN - moderate    SUBJECTIVE  Denies any CP or SOB.   CURRENT MEDS . aspirin EC  81 mg Oral Daily  . carvedilol  12.5 mg Oral BID WC  . furosemide  60 mg Intravenous BID  . heparin  5,000 Units Subcutaneous 3 times per day  . lisinopril  2.5 mg Oral Daily  . magnesium oxide  400 mg Oral Daily  . methimazole  10 mg Oral TID  . potassium chloride  40 mEq Oral Daily  . sodium chloride  3 mL Intravenous Q12H    OBJECTIVE  Filed Vitals:   04/19/14 1203 04/19/14 2033 04/19/14 2359 04/20/14 0425  BP: 106/76 101/62 104/63 95/61  Pulse: 79 77 78 78  Temp: 97.9 F (36.6 C) 98 F (36.7 C)  98.2 F (36.8 C)  TempSrc: Oral Oral  Oral  Resp: Height:      Weight:    163 lb 1.6 oz (73.982 kg)  SpO2: 99% 98%  96%    Intake/Output Summary (Last 24 hours) at 04/20/14 1211 Last data filed at 04/20/14 0850  Gross per 24 hour  Intake   1060 ml  Output    401 ml  Net    659 ml   Filed Weights   04/18/14 0403 04/19/14 0606 04/20/14 0425  Weight: 164 lb 0.4 oz (74.4 kg) 163 lb 5.8 oz (74.1 kg) 163 lb 1.6 oz (73.982 kg)    PHYSICAL EXAM  General: Pleasant, NAD. Neuro: Alert and oriented X 3. Moves all extremities spontaneously. Psych: Normal affect. HEENT:  R sided goister, large protruding eyeballs Neck: Supple without bruits or JVD. Lungs:  Resp regular and unlabored, CTA, decreased breath sound in R base, no rale Heart: RRR no s3, s4, or murmurs. Abdomen: Soft, non-tender, non-distended, BS + x 4.  Extremities: No clubbing, cyanosis. DP/PT/Radials 2+ and equal bilaterally. 1+ pitting edema.    Accessory Clinical Findings  CBC  Recent Labs  04/19/14 0406 04/20/14 0335  WBC 3.5* 3.2*  HGB 11.7* 10.2*  HCT 37.2 32.6*  MCV 86.1 85.1  PLT 141* 151   Basic Metabolic Panel  Recent Labs  04/18/14 0357 04/20/14 0335  NA 143 137  K 5.0 3.8  CL 107 105  CO2 30 30  GLUCOSE 98 111*  BUN 22 17  CREATININE 0.58 0.58  CALCIUM 8.0* 7.3*  MG  --  1.8    TELE NSR with HR 70-80s, 11 beats of NSVT    ECG  No new EKG  Echocardiogram 04/14/2014  LV EF: 20% -  25%  ------------------------------------------------------------------- Indications:   CHF - 428.0.  ------------------------------------------------------------------- History:  PMH: No prior cardiac history.  ------------------------------------------------------------------- Study Conclusions  - Left ventricle: The cavity size was moderately dilated. Wall thickness was normal. Systolic function was severely reduced. The estimated ejection fraction was in the range of 20% to 25%. - Aortic valve: There was trivial regurgitation. - Mitral valve: There was mild to moderate regurgitation. - Left atrium: The atrium was severely dilated. - Right ventricle: The cavity size was moderately dilated. Systolic  function was moderately to severely reduced. - Right atrium: The atrium was moderately dilated. - Tricuspid valve: There was moderate regurgitation. - Pulmonic valve: There was moderate regurgitation. - Pulmonary arteries: PA peak pressure: 54 mm Hg (S). - Pericardium, extracardiac: A trivial pericardial effusion was identified.     Radiology/Studies  Dg Chest 2 View  04/13/2014   CLINICAL DATA:  74 year old female with shortness of Breath. Initial encounter.  EXAM: CHEST  2 VIEW  COMPARISON:  None.  FINDINGS: Cardiomegaly. Moderate right and small left pleural effusions. Associated bibasilar opacification. No pneumothorax. No acute pulmonary edema. Leftward deviation of the trachea at  the thoracic inlet suggesting right thyroid goiter. Small surgical clips at the level of the left thyroid bed. No acute osseous abnormality identified.  IMPRESSION: 1. Moderate to severe cardiomegaly, consider pericardial effusion. 2. Moderate right and small left pleural effusions with associated lung base collapse or consolidation. 3. 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 Possible previous left thyroidectomy.   Electronically Signed   By: Augusto Gamble M.D.   On: 04/13/2014 13:11   US Soft Tissue Head/neck  04/16/2014   CLINICAL DATA:  Evaluate goiter  EXAM: THYROID ULTRASOUND  TECHNIQUE: Ultrasound examination of the thyroid gland and adjacent soft tissues was performed.  COMPARISON:  None.  FINDINGS: Right thyroid lobe  Measurements: 9.8 x 4.3 x 5.7 cm. Much of the entire right lobe is replaced by single large heterogeneous nodule. There are small areas of microcalcifications scattered throughout this nodule which is predominantly solid. Blood flow was identified throughout areas of the nodule.  Left thyroid lobe  Measurements: Previous left thyroidectomy. No residual or recurrent thyroid tissue noted within the left thyroid bed  Isthmus  Thickness: 0.5 cm.  No nodules visualized.  Lymphadenopathy  None visualized.  IMPRESSION: Large dominant nodule involves much of the right thyroid gland and contains areas of microcalcification. Findings meet consensus criteria for biopsy. Ultrasound-guided fine needle aspiration should be considered, as per the consensus statement: Management of Thyroid Nodules Detected at [place]: Society of Radiologists in Ultrasound Consensus Conference Statement. Radiology 2005; [id number].   Electronically Signed   By: Signa Kell M.D.   On: 04/16/2014 13:59    ASSESSMENT AND PLAN  74 y/o F with only h/o L-sided thyroidectomy presented with acute systolic CHF (EF 16-10%), hypokalemia, hypoalbuminemia, mild anemia, sinus tach and prolonged QTc. Found to likely be hyperthyroid with concern  for high-output CHF.  1. Acute systolic CHF with biventricular dysfunction   - due to NICM, suspected in setting of hyperthyroidism (LHC 04/18/14 - normal cors, mod pulm HTN with elevated LV fillings pressures)  - EF 20-25%, severely dilated LV, mod-severely reduced RV systolic function mild-mod MR, mod TR/PR  - continue BB, ACEI. Unable to uptitrate further  - physical exam shows decreased R basilar breath sound consistent with previously known R pleural effusion, otherwise, no rale on exam. Continue to have LE pitting edema likely related to R side heart failure  - will start  daily of PO lasix along with KCL.   - likely need LifeVest as she has been having NSVT on telemetry overnight. Likely discharge tomorrow after fitted with lifevest. No further recommendation per PT.  2. New hyperthyroidism with right thyroid nodule (remote h/o L thyroidectomy)  - TSH severely suppressed and free T4 elevated  - methimazole 10 mg TID started - appreciate medicine consult  - thyroid ultrasound shows large right-sided nodule, will need FNA biopsy  - IM scheduled endocrine appt 05/02/14 at 9am to  continue OP evaluation  3. Prolonged QTc/minimal WCT in setting of hypokalemia, hypomagnesemia  - MagOx added for low Mg; on KCl  4. HTN, see above  6. Mild anemia - stable, now with mild leukopenia and thrombocytopenia to be followed 7. RUE swelling - neg ULE duplex  Ramond Dial PA-C Pager: [phone]  Agree with note by Azalee Course PA-C  Diuresing. Feeling better. Transitioned to PO diuretic 80 mg Lasix daily. May require more than this. CRI. Still has 2+pitting edema. Paperwork for LifeVest filled out.Prob OK for DC home AM after LifeVest fitted. TCM 7/   Runell Gess, M.D., FACP, [facility], [facility] [address] [place], [place]  [address]  [phone] 04/20/2014 1:21 PM

## 2014-04-21 ENCOUNTER — Telehealth: Payer: Self-pay | Admitting: Physician Assistant

## 2014-04-21 ENCOUNTER — Encounter (HOSPITAL_COMMUNITY): Payer: Self-pay | Admitting: Physician Assistant

## 2014-04-21 ENCOUNTER — Other Ambulatory Visit: Payer: Self-pay | Admitting: Physician Assistant

## 2014-04-21 DIAGNOSIS — D551 Anemia due to other disorders of glutathione metabolism: Secondary | ICD-10-CM

## 2014-04-21 DIAGNOSIS — I5021 Acute systolic (congestive) heart failure: Secondary | ICD-10-CM

## 2014-04-21 LAB — BASIC METABOLIC PANEL
Anion gap: 3 — ABNORMAL LOW (ref 5–15)
BUN: 16 mg/dL (ref 6–23)
CALCIUM: 7.8 mg/dL — AB (ref 8.4–10.5)
CHLORIDE: 105 mmol/L (ref 96–112)
CO2: 31 mmol/L (ref 19–32)
Creatinine, Ser: 0.59 mg/dL (ref 0.50–1.10)
GFR calc Af Amer: >90 mL/min (ref >90)
GFR calc non Af Amer: 89 mL/min — ABNORMAL LOW (ref >90)
Glucose, Bld: 119 mg/dL — ABNORMAL HIGH (ref 70–99)
POTASSIUM: 4.2 mmol/L (ref 3.5–5.1)
Sodium: 139 mmol/L (ref 135–145)

## 2014-04-21 LAB — MAGNESIUM: Magnesium: 2 mg/dL (ref 1.5–2.5)

## 2014-04-21 MED ORDER — FUROSEMIDE 80 MG PO TABS: 80.0000 mg | ORAL_TABLET | Freq: Every day | ORAL | Status: DC

## 2014-04-21 MED ORDER — ASPIRIN 81 MG PO TBEC: 81.0000 mg | DELAYED_RELEASE_TABLET | Freq: Every day | ORAL | Status: AC

## 2014-04-21 MED ORDER — LISINOPRIL 2.5 MG PO TABS: 2.5000 mg | ORAL_TABLET | Freq: Every day | ORAL | Status: DC

## 2014-04-21 MED ORDER — POTASSIUM CHLORIDE CRYS ER 20 MEQ PO TBCR: 40.0000 meq | EXTENDED_RELEASE_TABLET | Freq: Every day | ORAL | Status: DC

## 2014-04-21 MED ORDER — CARVEDILOL 12.5 MG PO TABS: 12.5000 mg | ORAL_TABLET | Freq: Two times a day (BID) | ORAL | Status: DC

## 2014-04-21 MED ORDER — MAGNESIUM OXIDE 400 (241.3 MG) MG PO TABS: 400.0000 mg | ORAL_TABLET | Freq: Every day | ORAL | Status: DC

## 2014-04-21 MED ORDER — METHIMAZOLE 10 MG PO TABS: 10.0000 mg | ORAL_TABLET | Freq: Three times a day (TID) | ORAL | Status: DC

## 2014-04-21 NOTE — Discharge Instructions (Signed)
Avoid too much salt in food as excessive salt intake will cause fluid overload. Drink between 32 to 64 oz water per day, avoid drink more than 2L of fluid. Weight yourself daily, if weight increase by more than 3 lbs overnight or 5 lbs in a single week, please call cardiology office who will instruct you to take additional dose of lasix.

## 2014-04-21 NOTE — Telephone Encounter (Signed)
New message ° ° ° ° °TCM appt on 04-28-14 with Katie---per Hao. °

## 2014-04-21 NOTE — Progress Notes (Signed)
Patient Name: Leslie Kline Date of Encounter: 04/21/2014     Principal Problem:   Acute systolic CHF (congestive heart failure) Active Problems:   Hyperthyroidism   Thyroid goiter   Prolonged QT interval   Hypomagnesemia   Hypokalemia   Anemia   Anasarca   Essential hypertension   Hypernatremia   Normal coronary arteries   Nonischemic cardiomyopathy   Pulmonary HTN - moderate    SUBJECTIVE  Pt is feeling better today, denies CP and SOB. Able to walk the halls without DOE. Says edema has improved. Denies cough, N/V, lightheadedness and dizziness.  CURRENT MEDS . aspirin EC  81 mg Oral Daily  . carvedilol  12.5 mg Oral BID WC  . furosemide  80 mg Oral Daily  . heparin  5,000 Units Subcutaneous 3 times per day  . lisinopril  2.5 mg Oral Daily  . magnesium oxide  400 mg Oral Daily  . methimazole  10 mg Oral TID  . potassium chloride  40 mEq Oral Daily  . sodium chloride  3 mL Intravenous Q12H    OBJECTIVE  Filed Vitals:   04/20/14 2123 04/21/14 0210 04/21/14 0509 04/21/14 1034  BP: 91/45 100/64 104/65 104/60  Pulse: 76 84 76 75  Temp: 98.2 F (36.8 C)  97.9 F (36.6 C)   TempSrc: Oral  Oral   Resp: Height:      Weight:   164 lb (74.39 kg)   SpO2: 97% 96% 97%     Intake/Output Summary (Last 24 hours) at 04/21/14 1140 Last data filed at 04/21/14 0912  Gross per 24 hour  Intake   1693 ml  Output    975 ml  Net    718 ml   Filed Weights   04/19/14 0606 04/20/14 0425 04/21/14 0509  Weight: 163 lb 5.8 oz (74.1 kg) 163 lb 1.6 oz (73.982 kg) 164 lb (74.39 kg)    PHYSICAL EXAM  General: Pleasant, NAD. Neuro: Alert and oriented X 3. Moves all extremities spontaneously. Psych: Normal affect. HEENT:  R sided goiter, Exophthalmos  Neck: Elevated JVD. Right sided goiter Lungs:  Resp regular and unlabored, CTA, decreased breath sound in R base, no rale Heart: RRR, possible S3, 2/6 systolic murmur. No rubs noted. R femoral hematoma Abdomen: Soft,  non-tender, non-distended, BS + x 4. Extremities: No clubbing, cyanosis. DP/PT/Radials 2+ and equal bilaterally. 1+ pitting edema bilaterally.  Accessory Clinical Findings  CBC  Recent Labs  04/19/14 0406 04/20/14 0335  WBC 3.5* 3.2*  HGB 11.7* 10.2*  HCT 37.2 32.6*  MCV 86.1 85.1  PLT 141* 151   Basic Metabolic Panel  Recent Labs  04/20/14 0335 04/21/14 0529  NA 137 139  K 3.8 4.2  CL 105 105  CO2 30 31  GLUCOSE 111* 119*  BUN 17 16  CREATININE 0.58 0.59  CALCIUM 7.3* 7.8*  MG 1.8 2.0    TELE NSR with HR 70-80s, 5 beats V tach this morning    ECG  No new EKG  Echocardiogram 04/14/2014  LV EF: 20% -  25%  ------------------------------------------------------------------- Indications:   CHF - 428.0.  ------------------------------------------------------------------- History:  PMH: No prior cardiac history.  ------------------------------------------------------------------- Study Conclusions  - Left ventricle: The cavity size was moderately dilated. Wall thickness was normal. Systolic function was severely reduced. The estimated ejection fraction was in the range of 20% to 25%. - Aortic valve: There was trivial regurgitation. - Mitral valve: There was mild to moderate regurgitation. - Left  atrium: The atrium was severely dilated. - Right ventricle: The cavity size was moderately dilated. Systolic function was moderately to severely reduced. - Right atrium: The atrium was moderately dilated. - Tricuspid valve: There was moderate regurgitation. - Pulmonic valve: There was moderate regurgitation. - Pulmonary arteries: PA peak pressure: 54 mm Hg (S). - Pericardium, extracardiac: A trivial pericardial effusion was identified.     Radiology/Studies  Dg Chest 2 View  04/13/2014   CLINICAL DATA:  74 year old female with shortness of Breath. Initial encounter.  EXAM: CHEST  2 VIEW  COMPARISON:  None.  FINDINGS: Cardiomegaly. Moderate  right and small left pleural effusions. Associated bibasilar opacification. No pneumothorax. No acute pulmonary edema. Leftward deviation of the trachea at the thoracic inlet suggesting right thyroid goiter. Small surgical clips at the level of the left thyroid bed. No acute osseous abnormality identified.  IMPRESSION: 1. Moderate to severe cardiomegaly, consider pericardial effusion. 2. Moderate right and small left pleural effusions with associated lung base collapse or consolidation. 3. 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 Possible previous left thyroidectomy.   Electronically Signed   By: Augusto Gamble M.D.   On: 04/13/2014 13:11   US Soft Tissue Head/neck  04/16/2014   CLINICAL DATA:  Evaluate goiter  EXAM: THYROID ULTRASOUND  TECHNIQUE: Ultrasound examination of the thyroid gland and adjacent soft tissues was performed.  COMPARISON:  None.  FINDINGS: Right thyroid lobe  Measurements: 9.8 x 4.3 x 5.7 cm. Much of the entire right lobe is replaced by single large heterogeneous nodule. There are small areas of microcalcifications scattered throughout this nodule which is predominantly solid. Blood flow was identified throughout areas of the nodule.  Left thyroid lobe  Measurements: Previous left thyroidectomy. No residual or recurrent thyroid tissue noted within the left thyroid bed  Isthmus  Thickness: 0.5 cm.  No nodules visualized.  Lymphadenopathy  None visualized.  IMPRESSION: Large dominant nodule involves much of the right thyroid gland and contains areas of microcalcification. Findings meet consensus criteria for biopsy. Ultrasound-guided fine needle aspiration should be considered, as per the consensus statement: Management of Thyroid Nodules Detected at [place]: Society of Radiologists in Ultrasound Consensus Conference Statement. Radiology 2005; [id number].   Electronically Signed   By: Signa Kell M.D.   On: 04/16/2014 13:59    ASSESSMENT AND PLAN  74 y/o F with only h/o L-sided thyroidectomy presented  with acute systolic CHF (EF 16-10%), hypokalemia, hypoalbuminemia, mild anemia, sinus tach and prolonged QTc. Found to likely be hyperthyroid with concern for high-output CHF.  1. Acute systolic CHF with biventricular dysfunction   - due to NICM, suspected in setting of hyperthyroidism (LHC 04/18/14 - normal cors, mod pulm HTN with elevated LV fillings pressures)  - EF 20-25%, severely dilated LV, mod-severely reduced RV systolic function mild-mod MR, mod TR/PR  - normal coronary on cath 1/25, R femoral hematoma, however no sign of peudoaneurysm  - continue BB, ACEI. Unable to uptitrate further  - physical exam shows decreased R basilar breath sound consistent with previously known R pleural effusion, otherwise, no rale on exam. Continue to have LE pitting edema likely related to R side heart failure  - Continue Lasix  PO on discharge  - Lifevest rep saw her yesterday, will be discharged with Lifevest  2. New hyperthyroidism with right thyroid nodule (remote h/o L thyroidectomy)  - TSH severely suppressed and free T4 elevated  - methimazole 10 mg TID started - appreciate medicine consult. Will continue upon discharge  - thyroid ultrasound shows large right-sided  nodule, will need FNA biopsy  - IM scheduled endocrine appt 05/02/14 at 9am to continue OP evaluation  3. Prolonged QTc/minimal WCT in setting of hypokalemia, hypomagnesemia  - MagOx added for low Mg; on KCl             - Mg 2.0, K+ 4.2 today  4. HTN, see above  6. Mild anemia - stable, now with mild leukopenia and thrombocytopenia to be followed 7. RUE swelling - neg ULE duplex  Ramond Dial PA-C Pager: [phone]  Agree with note by Azalee Course PA-C  Clinically improved On oral lasix.I/O doubt accurate. NISCM.On appropriate meds. Will be fitted with LifeVest later today then can be D/Cd. Will get dietary consult re low salt diet.TOC 7 then return OV with initial cardiologist.   Runell Gess, M.D., FACP, [facility], [facility] [address] [place], [place]  [address]  [phone] 04/21/2014 1:34 PM

## 2014-04-21 NOTE — Discharge Summary (Signed)
Discharge Summary   Patient ID: Leslie Kline,  MRN: [id number], DOB/AGE: 73/10/1940 73 y.o.  Admit date: 04/13/2014 Discharge date: 04/21/2014  Primary Care Provider: No primary care provider on file. Primary Cardiologist: New- Dr. Anne Kline  Discharge Diagnoses Principal Problem:   Acute systolic CHF (congestive heart failure) Active Problems:   Hyperthyroidism   Thyroid goiter   Prolonged QT interval   Hypomagnesemia   Hypokalemia   Anemia   Anasarca   Essential hypertension   Hypernatremia   Normal coronary arteries   Nonischemic cardiomyopathy   Pulmonary HTN - moderate   Allergies No Known Allergies  Procedures  Echocardiogram 04/14/2014 LV EF: 20% -  25%  ------------------------------------------------------------------- Indications:   CHF - 428.0.  ------------------------------------------------------------------- History:  PMH: No prior cardiac history.  ------------------------------------------------------------------- Study Conclusions  - Left ventricle: The cavity size was moderately dilated. Wall thickness was normal. Systolic function was severely reduced. The estimated ejection fraction was in the range of 20% to 25%. - Aortic valve: There was trivial regurgitation. - Mitral valve: There was mild to moderate regurgitation. - Left atrium: The atrium was severely dilated. - Right ventricle: The cavity size was moderately dilated. Systolic function was moderately to severely reduced. - Right atrium: The atrium was moderately dilated. - Tricuspid valve: There was moderate regurgitation. - Pulmonic valve: There was moderate regurgitation. - Pulmonary arteries: PA peak pressure: 54 mm Hg (S). - Pericardium, extracardiac: A trivial pericardial effusion was identified.     Cardiac catheterization 04/18/2014 Procedural Findings: Hemodynamics RA 16/21 mean 17 mm Hg RV 43/17 mm Hg PA 43/22 mean 31 mm Hg PCWP 23/23 mean 20 mm Hg LV  104/20 mm Hg AO 104/71 mean 84 mm Hg  Oxygen saturations: PA 52% AO 95%  Cardiac Output (Fick) 3.5 L/min  Cardiac Index (Fick) 1.91 L/min/meter squared.  Coronary angiography: Coronary dominance: codominant  Left mainstem: Normal  Left anterior descending (LAD): Normal  Left circumflex (LCx): Normal  Right coronary artery (RCA): Normal  Left ventriculography: Not done  Final Conclusions:  1. Normal coronary anatomy 2. Moderate pulmonary HTN with elevated left ventricular filling pressures.  Recommendations: Medical management of CHF.    Hospital Course  Leslie Kline is a 73 year old African-American female with past medical history of hypertension. Unfortunately she does not seek routine medical service for preventative care. He has not been evaluated by her medical providers in over 6 years. She takes no medications at home. And she denies any prior family history of heart disease. He was in her usual state of health until one week ago when she developed dyspnea on exertion. She also noted a nonproductive cough for the last week. She denies any recent fever or chill. Although she does denies orthopnea, and paroxysmal nocturnal dyspnea, she does endorse significant bilateral lower extremity edema. She finally decided to seek medical attention at [facility] on 04/13/2014. Initial workup was notable for elevated BNP of 1437. CBC showed a mild anemia of 11. BMP was notable for mild hypokalemia of 3.2, albumin 2.7, creatinine 0.33. Chest x-ray demonstrated a moderate to severe cardiomegaly, moderate right and small left pleural effusion. EKG shows sinus tachycardia with ventricular rate of 120s with prolonged QTC.  Echocardiogram was obtained on 04/14/2014 which showed EF 20-25%, mild to moderate MR, moderately reduced LVEF, moderate TR/PR, PA peak pressure 54. She was diuresed aggressively for acute systolic heart failure with biventricular dysfunction. Initial TSH was  severely depressed. She was noted to have a right-sided goiter. Patient endorse a previous left-sided  thyroidectomy in the past. Free T4 was obtained which was elevated. Internal medicine service was consulted for hyperthyroidism. She was placed on methimazole 10 mg 3 times a day. Her heart failure was felt to likely related to her hyperthyroidism resulting in high flow heart failure and eventually biventricular failure. She underwent a scheduled heart catheterization on 04/18/2014 which showed wedge pressure 20 mmHg, cardiac index 1.91, cardiac output 3.5, normal coronaries. She was diuresed further and transition to oral 80mg  daily Lasix on 04/20/2014.   She was seen in the morning of 04/13/2014, she continued to have mild lower extremity edema which is likely the result of right heart failure and her hyperthyroidism. Her lung is clear and her shortness breath has resolved, she is considered stable for discharge from cardiology perspective. Given her severely decreased EF and occasional nonsustained VT noted on telemetry, she is at high risk for out of the hospital primary cardiac event. She was placed on LifeVest. She will need a repeat echocardiogram in 3 month, if EF continued to be low, ICD should be considered. I have scheduled outpatient transition of care outpatient follow-up in 7 days and to follow-up with Dr. Anne Kline after that. Patient will also have follow-up with endocrinology service for continuous treatment of hyperthyroidism. She will likely need a thyroid needle biopsy at some point.     Discharge Vitals Blood pressure 100/56, pulse 72, temperature 98 F (36.7 C), temperature source Oral, resp. rate 18, height 5\' 6"  (1.676 m), weight 164 lb (74.39 kg), SpO2 97 %.  Filed Weights   04/19/14 0606 04/20/14 0425 04/21/14 0509  Weight: 163 lb 5.8 oz (74.1 kg) 163 lb 1.6 oz (73.982 kg) 164 lb (74.39 kg)    Labs  CBC  Recent Labs  04/19/14 0406 04/20/14 0335  WBC 3.5* 3.2*  HGB 11.7*  10.2*  HCT 37.2 32.6*  MCV 86.1 85.1  PLT 141* 151   Basic Metabolic Panel  Recent Labs  04/20/14 0335 04/21/14 0529  NA 137 139  K 3.8 4.2  CL 105 105  CO2 30 31  GLUCOSE 111* 119*  BUN 17 16  CREATININE 0.58 0.59  CALCIUM 7.3* 7.8*  MG 1.8 2.0    Disposition  Pt is being discharged home today in good condition.  Follow-up Plans & Appointments      Follow-up Information    Follow up with Carlus Pavlov, MD On 04/26/2014.   Specialty:  Internal Medicine   Why:  Tuesday @ 8:00AM per Leitha Schuller information:   [address] [place] [address] [phone]       Follow up with Janetta Hora, PA-C On 04/28/2014.   Specialty:  Cardiology   Why:  9:30am. Transition of Care. Will need obtain BMET on the same day in [address] office lab to check renal function.    Contact information:   [address] [place] [address] [phone]       Follow up with Donato Schultz, MD On 05/13/2014.   Specialty:  Cardiology   Why:  9:15am   Contact information:   [address] [place] [address] [phone]       Discharge Medications    Medication List    STOP taking these medications        aspirin-sod bicarb-citric acid 325 MG Tbef tablet  Commonly known as:  ALKA-SELTZER      TAKE these medications  aspirin 81 MG EC tablet  Take 1 tablet (81 mg total) by mouth daily.     carvedilol 12.5 MG tablet  Commonly known as:  COREG  Take 1 tablet (12.5 mg total) by mouth 2 (two) times daily with a meal.     furosemide 80 MG tablet  Commonly known as:  LASIX  Take 1 tablet (80 mg total) by mouth daily.     lisinopril 2.5 MG tablet  Commonly known as:  PRINIVIL,ZESTRIL  Take 1 tablet (2.5 mg total) by mouth daily.     magnesium oxide 400 (241.3 MG) MG tablet  Commonly known as:  MAG-OX  Take 1 tablet (400 mg total) by mouth daily.     methimazole 10 MG tablet  Commonly known  as:  TAPAZOLE  Take 1 tablet (10 mg total) by mouth 3 (three) times daily.     potassium chloride SA 20 MEQ tablet  Commonly known as:  K-DUR,KLOR-CON  Take 2 tablets (40 mEq total) by mouth daily.        Outstanding Labs/Studies  Outpatient echo in 3 month to reassess EF  Duration of Discharge Encounter   Greater than 30 minutes including physician time.  Ramond Dial PA-C Pager: [phone] 04/21/2014, 5:17 PM

## 2014-04-21 NOTE — Progress Notes (Signed)
Pt being discharged home. Pt is being discharged home with life vest. Daughter is in room while life vest teaching is being done.

## 2014-04-22 NOTE — Telephone Encounter (Signed)
No answer/VM full.

## 2014-04-25 NOTE — Telephone Encounter (Signed)
Left message for patient that we are calling regarding recent hospital d/c and f/u appointment to call office and ask for Triage nurse

## 2014-04-26 ENCOUNTER — Ambulatory Visit (INDEPENDENT_AMBULATORY_CARE_PROVIDER_SITE_OTHER): Payer: Medicare HMO | Admitting: Internal Medicine

## 2014-04-26 ENCOUNTER — Encounter: Payer: Self-pay | Admitting: Internal Medicine

## 2014-04-26 VITALS — BP 122/68 | HR 95 | Temp 98.0°F | Resp 12 | Ht 64.0 in | Wt 169.8 lb

## 2014-04-26 DIAGNOSIS — E0590 Thyrotoxicosis, unspecified without thyrotoxic crisis or storm: Secondary | ICD-10-CM

## 2014-04-26 DIAGNOSIS — E049 Nontoxic goiter, unspecified: Secondary | ICD-10-CM

## 2014-04-26 LAB — THYROTROPIN RECEPTOR AUTOABS: Thyrotropin Receptor Ab: 77.8 % — ABNORMAL HIGH (ref <=16.0)

## 2014-04-26 NOTE — Progress Notes (Signed)
Patient ID: Leslie Kline, female   DOB: 03-19-1941, 74 y.o.   MRN: [id number]   HPI  Leslie Kline is a 74 y.o.-year-old female, referred by the Hospitalist team, Dr. Isidoro Donning, for management of thyrotoxicosis and goiter. She is here with her daughter who offers part of the hx.  No PCP.  She noticed SOB for ~3 month. Went to the hospital 04/13/2014 for this >> found to be thyrotoxic and having CHF with an EF 20-25%.   Patient has a history of previous left hemithyroidectomy - 20-25 years ago - ? dx. She had a recent thyroid ultrasound (04/16/2014) showing a large right lobe heterogeneous nodule. The size of the right lobe is [phone] cm. There are small areas of microcalcifications scattered throughout the nodule, which is predominantly solid. Blood flow was identified throughout areas of the nodule.  I reviewed pt's thyroid tests - she was recently found to be thyrotoxic: Lab Results  Component Value Date   TSH 0.046* 04/13/2014   TSH 0.013* 04/13/2014   FREET4 5.36* 04/14/2014    Component     Latest Ref Rng 04/15/2014  Thyrotropin Receptor Ab     <=16.0 % 77.8 (H)   She was started on MMI 10 mg 3x a day 2 weeks ago. She feels better.  She was also started on Coreg 12.5 mg 2x a day.  Pt denies feeling nodules in neck, hoarseness, dysphagia/odynophagia, SOB with lying down.  She c/o: - + weight loss: daughter believes ~100 lbs in last 6 mo - + fatigue - + excessive sweating/heat intolerance ("always") - + tremors - + occasional anxiety - no palpitations - no previous hyperdefecation - recently diarrhea after hospital stay - on MagOx  Pt does not have a FH of thyroid ds. No FH of thyroid cancer. No h/o radiation tx to head or neck.  No seaweed or kelp, no recent contrast studies. No steroid use. No herbal supplements.   I reviewed her chart and she also has a history of congestive heart failure - recent dx at last hosp.  ROS: Constitutional: See HPI, + nocturia Eyes: no blurry  vision, no xerophthalmia ENT: no sore throat, + nodule palpated in R throat, no dysphagia/odynophagia, no hoarseness Cardiovascular: no CP/+ SOB/no palpitations/+ leg swelling Respiratory: no cough/+ SOB Gastrointestinal: no N/V/+ D/no C Musculoskeletal: no muscle/joint aches Skin: no rashes, + itching Neurological: no tremors/numbness/tingling/dizziness Psychiatric: no depression/anxiety  Past Medical History  Diagnosis Date  . Hypertension   . CHF (congestive heart failure)   . Hypokalemia 04/13/2014  . Shortness of breath dyspnea   . Nonischemic cardiomyopathy     EF 20-25% Jan 2016, cath 04/18/2014 normal coronary. Likely result of uncontrolled hyperthyroidism  . Chronic systolic heart failure   . Goiter     R sided, s/p L side thyroidectomy. R side goiter likely need biopsy in the future  . Nonsustained ventricular tachycardia     no persistent v-tach, asymptomatic, likely related to LV dysfunction, placed on Lifevest  . Prolonged Q-T interval on ECG   . Anemia   . Anasarca     related to combination of heart failure and uncontrolled hyperthyroidism   Past Surgical History  Procedure Laterality Date  . Tubal ligation    . Goiter    . Tonsillectomy    . Abdominal hysterectomy    . Left and right heart catheterization with coronary angiogram N/A 04/18/2014    Procedure: LEFT AND RIGHT HEART CATHETERIZATION WITH CORONARY ANGIOGRAM;  Surgeon: Peter M Swaziland, MD;  Location: [facility] CATH LAB;  Service: Cardiovascular;  Laterality: N/A;   History   Social History  . Marital Status: widowed    Spouse Name: N/A    Number of Children: 8   .   Social History Main Topics  . Smoking status: Never Smoker   . Smokeless tobacco: Never Used  . Alcohol Use: No  . Drug Use: No    Current Outpatient Prescriptions on File Prior to Visit  Medication Sig Dispense Refill  . aspirin EC 81 MG EC tablet Take 1 tablet (81 mg total) by mouth daily.    . carvedilol (COREG) 12.5 MG tablet Take  1 tablet (12.5 mg total) by mouth 2 (two) times daily with a meal. 60 tablet 5  . furosemide (LASIX) 80 MG tablet Take 1 tablet (80 mg total) by mouth daily. 30 tablet 2  . lisinopril (PRINIVIL,ZESTRIL) 2.5 MG tablet Take 1 tablet (2.5 mg total) by mouth daily. 30 tablet 5  . magnesium oxide (MAG-OX) 400 (241.3 MG) MG tablet Take 1 tablet (400 mg total) by mouth daily. 30 tablet 0  . methimazole (TAPAZOLE) 10 MG tablet Take 1 tablet (10 mg total) by mouth 3 (three) times daily. 90 tablet 1  . potassium chloride SA (K-DUR,KLOR-CON) 20 MEQ tablet Take 2 tablets (40 mEq total) by mouth daily. 60 tablet 2   No current facility-administered medications on file prior to visit.   No Known Allergies Family History  Problem Relation Age of Onset  . Diabetes Maternal Grandfather     PE: BP 122/68 mmHg  Pulse 95  Temp(Src) 98 F (36.7 C) (Oral)  Resp 12  Ht  (1.626 m)  Wt 169 lb 12.8 oz (77.021 kg)  BMI 29.13 kg/m2  SpO2 96% Wt Readings from Last 3 Encounters:  04/26/14 169 lb 12.8 oz (77.021 kg)  04/21/14 164 lb (74.39 kg)   Constitutional: overweight, in NAD Eyes: PERRLA, EOMI, + mild exophthalmos, no lid lag, mild stare ENT: moist mucous membranes, + R thyromegaly, no thyroid bruits, no cervical lymphadenopathy Cardiovascular: RRR, No MRG, + LE swelling Respiratory: CTA B; + life vest on Gastrointestinal: abdomen soft, NT, ND, BS+ Musculoskeletal: no deformities, strength intact in all 4 Skin: moist, warm, no rashes Neurological: + tremor with outstretched hands, DTR normal in all 4  ASSESSMENT: 1. 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 Patient with a recently found low TSH, with thyrotoxic sxs: weight loss, heat intolerance, hyperdefecation, palpitations, anxiety.  - she does not appear to have exogenous causes for the low TSH.  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 Due to the severity of her ds (complicated by CHF), I suggested either RAI tx or surgery. She refuses sx. Will go ahead with RAI tx. Until the Tx, we will continue MMI 10 mg 3x a day. - will continue her beta blocker -  she is tachycardic and tremulous - I advised her to join my chart to communicate easier - RTC in 1.5 months  Pt scheduled for RAI tx 05/16/2014 at 10 am. Until then, continue MMI 10 mg TID + Cored 12.5 mg bid.

## 2014-04-26 NOTE — Patient Instructions (Signed)
We will schedule the Radioactive iodine treatment for you at [facility].  Please stop Methimazole 4 days before the treatment and do not restart it after the treatment.  Please return in 1 month after the RAI treatment.  Radioiodine (I-131) Therapy for Hyperthyroidism Radioiodine (I-131) therapy for hyperthyroidism is a procedure used to treat an overactive thyroid (hyperthyroidism). The patient swallows I-131, which is a radioactive form of iodine. The I-131 destroys thyroid cells. The thyroid is a gland in the neck. It makes thyroid hormones, which control how cells throughout the body use energy. Hyperthyroidism is usually caused by Grave's disease or by growths within the thyroid (nodules). Symptoms may include:  Nervousness.  Irritability.  Problems with sleep.  Tiredness.  Fast heart rate.  Shaky hands.  Sweating.  Heat sensitivity.  Unintended weight loss.  Brittle hair.  Enlarged thyroid gland.  Menstrual changes.  Frequent stools. LET YOUR CAREGIVER KNOW ABOUT:   All allergies.  All medications that you are taking, including over-the-counter and prescription drugs, dietary supplements, vitamins, or herbal preparations.  Any previous complications from this or other procedures.  Smoking history.  Possibility of pregnancy.  History of bleeding problems.  Any other health problems. RISKS AND COMPLICATIONS  Risks of the procedure include:  Slight pain in the area of the thyroid gland. BEFORE THE PROCEDURE  If you are a woman, you may be asked to have pregnancy testing before the procedure.  If you have been taking thyroid medicines, you will usually be asked to stop them three days before your procedure.  You will usually be asked to stop eating and drinking at midnight the day of your procedure.  You will need to plan for someone to drive you home after the procedure. PROCEDURE  You will be asked to swallow the radioactive iodine in  either pill or liquid form. It can take 1-3 months for the treatment to work. The treatment is most effective at about 3-6 months after the dose of iodine has been given. In most people, a single dose of radioactive iodine resolves their hyperthyroidism, but a few people will require a second dose.  AFTER THE PROCEDURE  Because you will be giving off tiny amounts of radiation for several days, there are some special precautions you will be asked to follow for about 2-4 days after the procedure:  Avoid being around babies or pregnant women.  Do not use public bathrooms.  Flush twice after using the toilet.  Take a bath or shower every day.  Practice good hand washing.  Drink fluids normally.  Use disposable utensils, or clean your utensils separately from those of others.  Sleep alone.  Do not engage in intimate contact.  Wash your sheets, towels, and clothes each day, by themselves.  Do not make food for other people. Other precautions include:  Stopping breastfeeding.  Do not attempt to become pregnant for at least a year after you have had the procedure.  When traveling, bring a letter of explanation from your caregiver for three months. Radioactivity may trip detectors in airports or other places. Because the point of the procedure is to destroy your thyroid gland, you will need to take thyroid hormone by mouth for the rest of your life. HOME CARE INSTRUCTIONS   Ask your caregiver when you should resume or start thyroid medications.  Take all medications exactly as directed.  Follow any prescribed diet.  Follow instructions regarding both rest and physical activity. SEEK IMMEDIATE MEDICAL CARE IF:  You  have a dry mouth.  You have a sore throat.  You have neck pain.  You have a tight sensation in the throat.  You have nausea and vomiting.  You are fatigued.  You have flushing.  You have bowel changes (either diarrhea or constipation). Document Released:  07/28/2008 Document Revised: 06/03/2011 Document Reviewed: 07/28/2008 [facility] Patient Information 2015 [place], [place]. This information is not intended to replace advice given to you by your health care provider. Make sure you discuss any questions you have with your health care provider.

## 2014-04-27 NOTE — Telephone Encounter (Signed)
Left message for pt to call back in regards to recent hospital d/c.

## 2014-04-28 ENCOUNTER — Other Ambulatory Visit (INDEPENDENT_AMBULATORY_CARE_PROVIDER_SITE_OTHER): Payer: Medicare HMO | Admitting: *Deleted

## 2014-04-28 ENCOUNTER — Encounter: Payer: Self-pay | Admitting: Physician Assistant

## 2014-04-28 ENCOUNTER — Ambulatory Visit (INDEPENDENT_AMBULATORY_CARE_PROVIDER_SITE_OTHER): Payer: Medicare HMO | Admitting: Physician Assistant

## 2014-04-28 VITALS — BP 132/82 | HR 98

## 2014-04-28 DIAGNOSIS — I5021 Acute systolic (congestive) heart failure: Secondary | ICD-10-CM

## 2014-04-28 LAB — BASIC METABOLIC PANEL
BUN: 11 mg/dL (ref 6–23)
CALCIUM: 8.6 mg/dL (ref 8.4–10.5)
CHLORIDE: 106 meq/L (ref 96–112)
CO2: 28 meq/L (ref 19–32)
CREATININE: 0.37 mg/dL — AB (ref 0.40–1.20)
GFR: 220.63 mL/min (ref >60.00)
GLUCOSE: 76 mg/dL (ref 70–99)
POTASSIUM: 4.2 meq/L (ref 3.5–5.1)
SODIUM: 140 meq/L (ref 135–145)

## 2014-04-28 NOTE — Progress Notes (Signed)
Cardiology Office Note   Date:  04/28/2014   ID:  Leslie Kline, DOB 1940-04-04, MRN [id number]  PCP:  No primary care provider on file.  Cardiologist:  Dr. Anne Fu   Chronic systolic CHF post hospital follow up    History of Present Illness: Leslie Kline is a 74 y.o. female with a history of HTN and recent admission for new onset acute systolic CHF and uncontrolled hyperthyroidism who presents to clinic today for post hospital follow up.  She was admitted to [facility] from 04/13/14- 04/21/14 for newly diagnosed systolic CHF. Prior to this she had not seen a medical practitioner in over 6 years and was not taking any medications.  She finally decided to seek medical attention at [facility] on 04/13/2014. Chest x-ray demonstrated a moderate to severe cardiomegaly, moderate right and small left pleural effusion. EKG shows sinus tachycardia with ventricular rate of 120s with prolonged QTC.  2D ECHO on 04/14/2014 revealed EF 20-25%, mild to moderate MR, moderately reduced LVEF, moderate TR/PR, PA peak pressure 54. She was diuresed aggressively for acute systolic heart failure with biventricular dysfunction. Initial TSH was severely depressed. She was noted to have a right-sided goiter. Patient endorse a previous left-sided thyroidectomy in the past. Free T4 was obtained which was elevated. Internal medicine service was consulted for hyperthyroidism. She was placed on methimazole 10 mg 3 times a day. Her heart failure was felt to likely related to her hyperthyroidism resulting in high flow heart failure and eventually biventricular failure. She underwent a scheduled heart catheterization on 04/18/2014 which showed wedge pressure 20 mmHg, cardiac index 1.91, cardiac output 3.5, normal coronaries. She was diuresed further and transition to oral  daily Lasix on 04/20/2014. Given her severely decreased EF and occasional nonsustained VT noted on telemetry, she was thought to be high risk for out of the hospital  primary cardiac event and was placed on LifeVest.  Today she presents for post hospital follow up. She is feeling much better since discharge. No CP, SOB, orthopnea, PND, palpations, dizziness, syncope, tremors. She has chronic LE edema which is drastically improved from previous. She has been taking all of her medications as prescribed and tolerating them well. She has no complaints and is very pleased  to be feeling better.     Past Medical History  Diagnosis Date  . Hypertension   . Hypokalemia   . Nonischemic cardiomyopathy     EF 20-25% Jan 2016, cath 04/18/2014 normal coronary. Likely result of uncontrolled hyperthyroidism  . Chronic systolic heart failure   . Goiter     R sided, s/p L side thyroidectomy. R side goiter likely need biopsy in the future  . Nonsustained ventricular tachycardia     no persistent v-tach, asymptomatic, likely related to LV dysfunction, placed on Lifevest  . Prolonged Q-T interval on ECG   . Anemia   . Anasarca     related to combination of heart failure and uncontrolled hyperthyroidism    Past Surgical History  Procedure Laterality Date  . Tubal ligation    . Goiter    . Tonsillectomy    . Abdominal hysterectomy    . Left and right heart catheterization with coronary angiogram N/A 04/18/2014    Procedure: LEFT AND RIGHT HEART CATHETERIZATION WITH CORONARY ANGIOGRAM;  Surgeon: Peter M Swaziland, MD;  Location: [facility] CATH LAB;  Service: Cardiovascular;  Laterality: N/A;     Current Outpatient Prescriptions  Medication Sig Dispense Refill  . aspirin EC 81 MG EC tablet  Take 1 tablet (81 mg total) by mouth daily.    . carvedilol (COREG) 12.5 MG tablet Take 1 tablet (12.5 mg total) by mouth 2 (two) times daily with a meal. 60 tablet 5  . furosemide (LASIX) 80 MG tablet Take 1 tablet (80 mg total) by mouth daily. 30 tablet 2  . lisinopril (PRINIVIL,ZESTRIL) 2.5 MG tablet Take 1 tablet (2.5 mg total) by mouth daily. 30 tablet 5  . magnesium oxide (MAG-OX) 400  (241.3 MG) MG tablet Take 1 tablet (400 mg total) by mouth daily. 30 tablet 0  . methimazole (TAPAZOLE) 10 MG tablet Take 1 tablet (10 mg total) by mouth 3 (three) times daily. 90 tablet 1  . potassium chloride SA (K-DUR,KLOR-CON) 20 MEQ tablet Take 2 tablets (40 mEq total) by mouth daily. 60 tablet 2   No current facility-administered medications for this visit.    Allergies:   Review of patient's allergies indicates no known allergies.    Social History:  The patient  reports that she has never smoked. She has never used smokeless tobacco. She reports that she does not drink alcohol or use illicit drugs.   Family History:  The patient's family history includes Diabetes in her maternal grandfather.    ROS:  Please see the history of present illness.  All other systems are reviewed and negative.    PHYSICAL EXAM: VS:  BP 132/82 mmHg  Pulse 98 , BMI There is no weight on file to calculate BMI. GEN: Well nourished, well developed, in no acute distress HEENT: normal Neck: no JVD, carotid bruits, or masses Cardiac: RRR; no murmurs, rubs, or gallops, 1+ bilaterally pitting  LE edema  Respiratory:  clear to auscultation bilaterally, normal work of breathing GI: soft, nontender, nondistended, + BS MS: no deformity or atrophy Skin: warm and dry, no rash Neuro:  Strength and sensation are intact Psych: euthymic mood, full affect   EKG:  EKG is ordered today. The ekg ordered today demonstrates NSR HR 98. RAD.   Recent Labs: 04/13/2014: ALT 8; TSH 0.046* 04/18/2014: B Natriuretic Peptide 1070.1* 04/20/2014: Hemoglobin 10.2*; Platelets 151 04/21/2014: BUN 16; Creatinine 0.59; Magnesium 2.0; Potassium 4.2; Sodium 139    Lipid Panel No results found for: CHOL, TRIG, HDL, CHOLHDL, VLDL, LDLCALC, LDLDIRECT    Wt Readings from Last 3 Encounters:  04/26/14 169 lb 12.8 oz (77.021 kg)  04/21/14 164 lb (74.39 kg)      Other studies Reviewed: Additional studies/ records that were  reviewed today include: 2D ECHO, RHC. Review of the above records demonstrates: 2D ECHO on 04/14/2014 revealed EF 20-25%, mild to moderate MR, moderately reduced LVEF, moderate TR/PR, PA peak pressure 54.  Cardiac catheterization 04/18/2014 Procedural Findings: Hemodynamics RA 16/21 mean 17 mm Hg RV 43/17 mm Hg PA 43/22 mean 31 mm Hg PCWP 23/23 mean 20 mm Hg LV 104/20 mm Hg AO 104/71 mean 84 mm Hg  Oxygen saturations: PA 52% AO 95%  Cardiac Output (Fick) 3.5 L/min  Cardiac Index (Fick) 1.91 L/min/meter squared.  Coronary angiography: Coronary dominance: codominant  Left mainstem: Normal  Left anterior descending (LAD): Normal  Left circumflex (LCx): Normal  Right coronary artery (RCA): Normal  Left ventriculography: Not done  Final Conclusions:  1. Normal coronary anatomy 2. Moderate pulmonary HTN with elevated left ventricular filling pressures.  Recommendations: Medical management of CHF.     ASSESSMENT AND PLAN:   Leslie Kline is a 74 y.o. female with a history of HTN and recent admission for new onset  acute systolic CHF and uncontrolled hyperthyroidism who presents to clinic today for post hospital follow up.  Chronic systolic CHF-   2D ECHO on 04/14/2014 revealed EF 20-25%, mild to moderate MR, moderately reduced LVEF, moderate TR/PR, PA peak pressure 54. -- She was placed on LifeVest. She will need a repeat ECHO in 3 month (end April 2016). If EF remains <35%, ICD should be considered. -- Continue Coreg 12.5mg  BID and Lisinopril 2.5mg  qd. BP stable. These may be further up titrated at her next appointment if BP allows.  -- Continue Lasix 80mg  qd and Kdur . BMET today to check renal function and potassium   Hyperthryoidism- sx improved. -- Seen by Dr. Elvera Lennox on 04/26/14. 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 Patient refused sx. So planning to proceed with RAI tx. Until the Tx, she will continue MMI 10 mg 3x a day.  HTN-  BP well controlled today. 132/82 -- Continue coreg 12.5mg  BID, lisinopril 2.5mg     Current medicines are reviewed at length with the patient today.  The patient does not have concerns regarding medicines.  The following changes have been made:  no change  Labs/ tests ordered today include: BMET   Disposition:   FU with Dr. Anne Fu on 05/13/14 as previously scheduled.   Charlestine Massed  04/28/2014 10:01 AM    [facility] [address] [place], [place], [place]  [address] Phone: [phone]; Fax: [phone]

## 2014-04-28 NOTE — Patient Instructions (Signed)
Your physician recommends that you continue on your current medications as directed. Please refer to the Current Medication list given to you today.  Your physician recommends that you keep your appointment this month with Dr. Anne Fu.

## 2014-04-28 NOTE — Addendum Note (Signed)
Addended by: Tonita Phoenix on: 04/28/2014 09:34 AM   Modules accepted: Orders

## 2014-04-29 NOTE — Telephone Encounter (Signed)
Patient seen 04/28/14 with Bary Castilla, PA

## 2014-05-02 ENCOUNTER — Ambulatory Visit: Payer: Medicare HMO | Admitting: Internal Medicine

## 2014-05-13 ENCOUNTER — Ambulatory Visit (INDEPENDENT_AMBULATORY_CARE_PROVIDER_SITE_OTHER): Payer: Medicare HMO | Admitting: Cardiology

## 2014-05-13 ENCOUNTER — Encounter: Payer: Self-pay | Admitting: Cardiology

## 2014-05-13 VITALS — BP 120/78 | HR 69 | Ht 64.0 in | Wt 159.0 lb

## 2014-05-13 DIAGNOSIS — I5022 Chronic systolic (congestive) heart failure: Secondary | ICD-10-CM

## 2014-05-13 DIAGNOSIS — E0590 Thyrotoxicosis, unspecified without thyrotoxic crisis or storm: Secondary | ICD-10-CM

## 2014-05-13 DIAGNOSIS — I10 Essential (primary) hypertension: Secondary | ICD-10-CM

## 2014-05-13 MED ORDER — LISINOPRIL 5 MG PO TABS: 5.0000 mg | ORAL_TABLET | Freq: Every day | ORAL | Status: DC

## 2014-05-13 NOTE — Patient Instructions (Signed)
Please increase Lisinopril 5 mg a day. Continue all other medications as listed.  Follow up in 1 month with Dr Anne Fu.  Thank you for choosing [facility]!!

## 2014-05-13 NOTE — Progress Notes (Signed)
Cardiology Office Note   Date:  05/13/2014   ID:  Quanita Wolverton, DOB 08-14-1940, MRN [id number]  PCP:  No primary care provider on file.  Cardiologist:  Dr. Anne Fu   Chronic systolic CHF post hospital follow up    History of Present Illness: Leslie Kline is a 74 y.o. female with a history of HTN and recent admission for new onset acute systolic CHF and uncontrolled hyperthyroidism who presents to clinic today for follow up.  She was admitted to [facility] from 04/13/14- 04/21/14 for newly diagnosed systolic CHF. Prior to this she had not seen a medical practitioner in over 6 years and was not taking any medications.  She finally decided to seek medical attention at [facility] on 04/13/2014. Chest x-ray demonstrated a moderate to severe cardiomegaly, moderate right and small left pleural effusion. EKG shows sinus tachycardia with ventricular rate of 120s with prolonged QTC.   2D ECHO on 04/14/2014 revealed EF 20-25%, mild to moderate MR, moderately reduced LVEF, moderate TR/PR, PA peak pressure 54. She was diuresed aggressively for acute systolic heart failure with biventricular dysfunction. Initial TSH was severely depressed. She was noted to have a right-sided goiter. Patient endorse a previous left-sided thyroidectomy in the past. Free T4 was obtained which was elevated. Internal medicine service was consulted for hyperthyroidism. She was placed on methimazole 10 mg 3 times a day. Her heart failure was felt to likely related to her hyperthyroidism resulting in high flow heart failure and eventually biventricular failure. She underwent a scheduled heart catheterization on 04/18/2014 which showed wedge pressure 20 mmHg, cardiac index 1.91, cardiac output 3.5, normal coronaries. She was diuresed further and transition to oral 80mg  daily Lasix on 04/20/2014. Given her severely decreased EF and occasional nonsustained VT noted on telemetry, she was thought to be high risk for out of the hospital primary  cardiac event and was placed on LifeVest.  She is feeling much better. No CP, SOB, orthopnea, PND, palpations, dizziness, syncope, tremors. She has chronic LE edema which is drastically improved from previous. She has been taking all of her medications as prescribed and tolerating them well. She has no complaints and is very pleased  to be feeling better.  She is frustrated at times with her life vest.    Past Medical History  Diagnosis Date  . Hypertension   . Hypokalemia   . Nonischemic cardiomyopathy     EF 20-25% Jan 2016, cath 04/18/2014 normal coronary. Likely result of uncontrolled hyperthyroidism  . Chronic systolic heart failure   . Goiter     R sided, s/p L side thyroidectomy. R side goiter likely need biopsy in the future  . Nonsustained ventricular tachycardia     no persistent v-tach, asymptomatic, likely related to LV dysfunction, placed on Lifevest  . Prolonged Q-T interval on ECG   . Anemia   . Anasarca     related to combination of heart failure and uncontrolled hyperthyroidism    Past Surgical History  Procedure Laterality Date  . Tubal ligation    . Goiter    . Tonsillectomy    . Abdominal hysterectomy    . Left and right heart catheterization with coronary angiogram N/A 04/18/2014    Procedure: LEFT AND RIGHT HEART CATHETERIZATION WITH CORONARY ANGIOGRAM;  Surgeon: Peter M Swaziland, MD;  Location: [facility] CATH LAB;  Service: Cardiovascular;  Laterality: N/A;     Current Outpatient Prescriptions  Medication Sig Dispense Refill  . aspirin EC 81 MG EC tablet Take 1  tablet (81 mg total) by mouth daily.    . carvedilol (COREG) 12.5 MG tablet Take 1 tablet (12.5 mg total) by mouth 2 (two) times daily with a meal. 60 tablet 5  . furosemide (LASIX) 80 MG tablet Take 1 tablet (80 mg total) by mouth daily. 30 tablet 2  . lisinopril (PRINIVIL,ZESTRIL) 2.5 MG tablet Take 1 tablet (2.5 mg total) by mouth daily. 30 tablet 5  . magnesium oxide (MAG-OX) 400 (241.3 MG) MG tablet Take  1 tablet (400 mg total) by mouth daily. 30 tablet 0  . potassium chloride SA (K-DUR,KLOR-CON) 20 MEQ tablet Take 2 tablets (40 mEq total) by mouth daily. 60 tablet 2   No current facility-administered medications for this visit.    Allergies:   Review of patient's allergies indicates no known allergies.    Social History:  The patient  reports that she has never smoked. She has never used smokeless tobacco. She reports that she does not drink alcohol or use illicit drugs.   Family History:  The patient's family history includes Diabetes in her maternal grandfather.    ROS:  Please see the history of present illness.  All other systems are reviewed and negative.    PHYSICAL EXAM: VS:  BP 120/78 mmHg  Pulse 69  Ht  (1.626 m)  Wt 159 lb (72.122 kg)  BMI 27.28 kg/m2 , BMI Body mass index is 27.28 kg/(m^2). GEN: Well nourished, well developed, in no acute distress HEENT: normal Neck: no JVD, carotid bruits, or masses Cardiac: RRR; no murmurs, rubs, or gallops, 1+ bilaterally pitting  LE edema  Respiratory:  clear to auscultation bilaterally, normal work of breathing GI: soft, nontender, nondistended, + BS MS: no deformity or atrophy Skin: warm and dry, no rash Neuro:  Strength and sensation are intact Psych: euthymic mood, full affect   EKG:   The ekg demonstrates NSR HR 98. RAD.   Recent Labs: 04/13/2014: ALT 8; TSH 0.046* 04/18/2014: B Natriuretic Peptide 1070.1* 04/20/2014: Hemoglobin 10.2*; Platelets 151 04/21/2014: Magnesium 2.0 04/28/2014: BUN 11; Creatinine 0.37*; Potassium 4.2; Sodium 140    Lipid Panel    Wt Readings from Last 3 Encounters:  05/13/14 159 lb (72.122 kg)  04/26/14 169 lb 12.8 oz (77.021 kg)  04/21/14 164 lb (74.39 kg)      Other studies Reviewed: Additional studies/ records that were reviewed today include: 2D ECHO, RHC. Review of the above records demonstrates: 2D ECHO on 04/14/2014 revealed EF 20-25%, mild to moderate MR, moderately  reduced LVEF, moderate TR/PR, PA peak pressure 54.  Cardiac catheterization 04/18/2014 Procedural Findings: Hemodynamics RA 16/21 mean 17 mm Hg RV 43/17 mm Hg PA 43/22 mean 31 mm Hg PCWP 23/23 mean 20 mm Hg LV 104/20 mm Hg AO 104/71 mean 84 mm Hg  Oxygen saturations: PA 52% AO 95%  Cardiac Output (Fick) 3.5 L/min  Cardiac Index (Fick) 1.91 L/min/meter squared.  Coronary angiography: Coronary dominance: codominant  Left mainstem: Normal  Left anterior descending (LAD): Normal  Left circumflex (LCx): Normal  Right coronary artery (RCA): Normal  Left ventriculography: Not done  Final Conclusions:  1. Normal coronary anatomy 2. Moderate pulmonary HTN with elevated left ventricular filling pressures.  Recommendations: Medical management of CHF.     ASSESSMENT AND PLAN:   Nikya Busler is a 74 y.o. female with a history of HTN and recent admission 04/13/14 for new onset acute systolic CHF and uncontrolled hyperthyroidism who presents to clinic today for follow up.  Chronic systolic CHF-  2D ECHO on 04/14/2014 revealed EF 20-25%, mild to moderate MR, moderately reduced LVEF, moderate TR/PR, PA peak pressure 54. -- She was placed on LifeVest. She will need a repeat ECHO in 3 month (end April 2016). If EF remains <35%, ICD should be considered. -- Continue Coreg 12.5mg  BID and increase Lisinopril  qd. BP stable.   -- Continue Lasix  qd and Kdur . BMET reassuring.  Hyperthryoidism- sx improved. -- Seen by Dr. Elvera Lennox on 04/26/14. 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 proceed with RAI tx. Until the Tx, she will continue MMI 10 mg 3x a day. This is happening next week.  HTN- BP well controlled today.  -- Continue coreg 12.5mg  BID, lisinopril increased to     Current medicines are reviewed at length with the patient today.  The patient does not have concerns regarding medicines.  The following changes have been made:  no  change   Disposition:   FU in 1 month. Await echo read. May need ICD. LifeVest will be taken off at that point.  Mathews Robinsons, MD  05/13/2014 9:36 AM    [facility] [address] [place], [place], [place]  [address] Phone: [phone]; Fax: [phone]

## 2014-05-16 ENCOUNTER — Encounter (HOSPITAL_COMMUNITY)
Admission: RE | Admit: 2014-05-16 | Discharge: 2014-05-16 | Disposition: A | Discharge status: Home / Self Care | Payer: Medicare HMO | Source: Ambulatory Visit | Attending: Internal Medicine | Admitting: Internal Medicine

## 2014-05-16 ENCOUNTER — Encounter (HOSPITAL_COMMUNITY): Payer: Medicare HMO

## 2014-05-16 DIAGNOSIS — E0590 Thyrotoxicosis, unspecified without thyrotoxic crisis or storm: Secondary | ICD-10-CM | POA: Diagnosis not present

## 2014-05-16 MED ORDER — SODIUM IODIDE I 131 CAPSULE
15.7000 | Freq: Once | INTRAVENOUS | Status: AC | PRN
  Administered 2014-05-16: 15.7 via ORAL

## 2014-05-21 ENCOUNTER — Other Ambulatory Visit: Payer: Self-pay | Admitting: Physician Assistant

## 2014-05-23 NOTE — Telephone Encounter (Signed)
Please review for refill. Thanks!  

## 2014-06-09 ENCOUNTER — Ambulatory Visit (INDEPENDENT_AMBULATORY_CARE_PROVIDER_SITE_OTHER): Payer: Medicare HMO | Admitting: Internal Medicine

## 2014-06-09 ENCOUNTER — Encounter: Payer: Self-pay | Admitting: Internal Medicine

## 2014-06-09 VITALS — BP 112/78 | HR 68 | Temp 97.6°F | Wt 157.2 lb

## 2014-06-09 DIAGNOSIS — E0590 Thyrotoxicosis, unspecified without thyrotoxic crisis or storm: Secondary | ICD-10-CM

## 2014-06-09 LAB — T4, FREE: FREE T4: 4.25 ng/dL — AB (ref 0.60–1.60)

## 2014-06-09 LAB — TSH: TSH: 0.02 u[IU]/mL — ABNORMAL LOW (ref 0.35–4.50)

## 2014-06-09 LAB — T3, FREE: T3, Free: 9.6 pg/mL — ABNORMAL HIGH (ref 2.3–4.2)

## 2014-06-09 NOTE — Progress Notes (Signed)
Patient ID: [facility], female   DOB: [birth year], 74 y.o.   MRN: [id number]   HPI  Leslie Kline is a 74 y.o.-year-old female, initially referred by the Hospitalist team, Dr. Isidoro Donning, for management of thyrotoxicosis and goiter. She is here with her daughter who offers part of the hx. Last visit 04/2014. No PCP.  Reviewed hx: She noticed SOB for ~3 month. Went to the hospital 04/13/2014 for this >> found to be thyrotoxic and having CHF with an EF 20-25%.   Patient has a history of previous left hemithyroidectomy - 20-25 years ago - ? dx. She had a recent thyroid ultrasound (04/16/2014) showing a large right lobe heterogeneous nodule. The size of the right lobe is [phone] cm. There are small areas of microcalcifications scattered throughout the nodule, which is predominantly solid. Blood flow was identified throughout areas of the nodule.  She had RAI tx (15.7 mCi) on 05/16/2014.  I reviewed pt's thyroid tests - she was recently found to be thyrotoxic: Lab Results  Component Value Date   TSH 0.046* 04/13/2014   TSH 0.013* 04/13/2014   FREET4 5.36* 04/14/2014    Component     Latest Ref Rng 04/15/2014  Thyrotropin Receptor Ab     <=16.0 % 77.8 (H)   She was started on MMI 10 mg 3x a day - stopped 4 days prior to the RAI tx.  She was also started on Coreg 12.5 mg 2x a day.  Pt denies feeling nodules in neck, hoarseness, dysphagia/odynophagia, SOB with lying down. She feels her goiter is decreasing in size.  She c/o: - + weight loss: previously lost ~100 lbs in last 6 mo - + fatigue - + excessive sweating/heat intolerance ("always") - + tremors - + occasional anxiety - no palpitations - no hyperdefecation  I reviewed her chart and she also has a history of congestive heart failure - dx at last hosp.  ROS: Constitutional: See HPI, + nocturia Eyes: no blurry vision, no xerophthalmia ENT: no sore throat, + nodule palpated in R throat, no dysphagia/odynophagia, no  hoarseness Cardiovascular: no CP/+ SOB/no palpitations/+ leg swelling Respiratory: no cough/+ SOB Gastrointestinal: no N/V/D/C Musculoskeletal: no muscle/joint aches Skin: no rashes, + itching Neurological: no tremors/numbness/tingling/dizziness  I reviewed pt's medications, allergies, PMH, social hx, family hx, and changes were documented in the history of present illness. Otherwise, unchanged from my initial visit note:  Past Medical History  Diagnosis Date  . Hypertension   . Hypokalemia   . Nonischemic cardiomyopathy     EF 20-25% Jan 2016, cath 04/18/2014 normal coronary. Likely result of uncontrolled hyperthyroidism  . Chronic systolic heart failure   . Goiter     R sided, s/p L side thyroidectomy. R side goiter likely need biopsy in the future  . Nonsustained ventricular tachycardia     no persistent v-tach, asymptomatic, likely related to LV dysfunction, placed on Lifevest  . Prolonged Q-T interval on ECG   . Anemia   . Anasarca     related to combination of heart failure and uncontrolled hyperthyroidism   Past Surgical History  Procedure Laterality Date  . Tubal ligation    . Goiter    . Tonsillectomy    . Abdominal hysterectomy    . Left and right heart catheterization with coronary angiogram N/A 04/18/2014    Procedure: LEFT AND RIGHT HEART CATHETERIZATION WITH CORONARY ANGIOGRAM;  Surgeon: Peter M Swaziland, MD;  Location: [facility] CATH LAB;  Service: Cardiovascular;  Laterality: N/A;   History  Social History  . Marital Status: widowed    Spouse Name: N/A    Number of Children: 8   .   Social History Main Topics  . Smoking status: Never Smoker   . Smokeless tobacco: Never Used  . Alcohol Use: No  . Drug Use: No    Current Outpatient Prescriptions on File Prior to Visit  Medication Sig Dispense Refill  . aspirin EC 81 MG EC tablet Take 1 tablet (81 mg total) by mouth daily.    . carvedilol (COREG) 12.5 MG tablet Take 1 tablet (12.5 mg total) by mouth 2 (two)  times daily with a meal. 60 tablet 5  . furosemide (LASIX) 80 MG tablet Take 1 tablet (80 mg total) by mouth daily. 30 tablet 2  . lisinopril (PRINIVIL,ZESTRIL) 5 MG tablet Take 1 tablet (5 mg total) by mouth daily. 30 tablet 6  . MAGNESIUM-OXIDE 400 (241.3 MG) MG tablet TAKE 1 TABLET BY MOUTH DAILY 30 tablet 0  . potassium chloride SA (K-DUR,KLOR-CON) 20 MEQ tablet Take 2 tablets (40 mEq total) by mouth daily. 60 tablet 2   No current facility-administered medications on file prior to visit.   No Known Allergies Family History  Problem Relation Age of Onset  . Diabetes Maternal Grandfather    PE: BP 112/78 mmHg  Pulse 68  Temp(Src) 97.6 F (36.4 C) (Oral)  Wt 157 lb 3.2 oz (71.305 kg)  SpO2 97% Body mass index is 26.97 kg/(m^2). Wt Readings from Last 3 Encounters:  06/09/14 157 lb 3.2 oz (71.305 kg)  05/13/14 159 lb (72.122 kg)  04/26/14 169 lb 12.8 oz (77.021 kg)   Constitutional: overweight, in NAD Eyes: PERRLA, EOMI, + mild exophthalmos, no lid lag, mild stare ENT: moist mucous membranes, + R thyromegaly and palpable left thyroid lobe, no thyroid bruits, no cervical lymphadenopathy Cardiovascular: RRR, No MRG, no LE swelling Respiratory: CTA B; + life vest on Gastrointestinal: abdomen soft, NT, ND, BS+ Musculoskeletal: no deformities, strength intact in all 4 Skin: moist, warm, no rashes Neurological: + tremor with outstretched hands and head, DTR normal in all 4  ASSESSMENT: 1. 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 Large R thyroid nodule  PLAN:  1. Patient with h/o thyrotoxicosis, with thyrotoxic sxs: weight loss, heat intolerance, hyperdefecation, palpitations, anxiety. She is now s/p RAI tx 05/16/2014.  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 Due to the severity of her ds (complicated by CHF), I suggested either RAI tx or surgery. She refused sx. >> had RAI tx.  - check TFTs today, almost 1 mo after her RAI tx. - will continue her beta blocker - this is per cardiology - I advised her to join my chart to communicate easier - RTC in 4 months, but for repeat labs in 5-6 weeks.  2. Right thyroid nodule - Patient had a thyroid ultrasound in 03/2014 showing an enlarged right thyroid lobe, which is virtually replaced by a right thyroid nodule. At that point, the patient was thyrotoxic, so I would like to wait for her to become euthyroid before reimaging the thyroid and possibly performing a thyroid biopsy. I plan to repeat the ultrasound no later than January 2017.  Office Visit on 06/09/2014  Component Date  Value Ref Range Status  . TSH 06/09/2014 0.02* 0.35 - 4.50 uIU/mL Final  . Free T4 06/09/2014 4.25* 0.60 - 1.60 ng/dL Final  . T3, Free 25/18/9842 9.6* 2.3 - 4.2 pg/mL Final   Thyroid tests still abnormal, which is not unusual in the first month after RAI treatment. She is on a beta blocker. We'll continue to observe for now, repeating the thyroid tests in 5 weeks. The thyroid tests are very dynamic at this time, I will refrain from starting her on methimazole for now since she can plummet into hypothyroidism in the next month.

## 2014-06-09 NOTE — Patient Instructions (Signed)
Please stop at the lab.  Please come back for repeat labs in 5-6 weeks. Please schedule a lab appt for then.  Please come back for a follow-up appointment in 4 months.

## 2014-06-10 ENCOUNTER — Encounter: Payer: Self-pay | Admitting: *Deleted

## 2014-06-13 ENCOUNTER — Other Ambulatory Visit: Payer: Self-pay | Admitting: *Deleted

## 2014-06-13 ENCOUNTER — Encounter: Payer: Self-pay | Admitting: Cardiology

## 2014-06-13 ENCOUNTER — Ambulatory Visit (INDEPENDENT_AMBULATORY_CARE_PROVIDER_SITE_OTHER): Payer: Medicare HMO | Admitting: Cardiology

## 2014-06-13 VITALS — BP 110/68 | HR 87 | Ht 64.0 in | Wt 160.0 lb

## 2014-06-13 DIAGNOSIS — I429 Cardiomyopathy, unspecified: Secondary | ICD-10-CM

## 2014-06-13 DIAGNOSIS — I428 Other cardiomyopathies: Secondary | ICD-10-CM

## 2014-06-13 DIAGNOSIS — E0590 Thyrotoxicosis, unspecified without thyrotoxic crisis or storm: Secondary | ICD-10-CM

## 2014-06-13 DIAGNOSIS — I5022 Chronic systolic (congestive) heart failure: Secondary | ICD-10-CM

## 2014-06-13 MED ORDER — LISINOPRIL 10 MG PO TABS: 10.0000 mg | ORAL_TABLET | Freq: Every day | ORAL | Status: DC

## 2014-06-13 NOTE — Patient Instructions (Signed)
Please increase your Lisinopril to 10 mg a day. Continue all other medications as listed.  Your physician has requested that you have an echocardiogram. Echocardiography is a painless test that uses sound waves to create images of your heart. It provides your doctor with information about the size and shape of your heart and how well your heart's chambers and valves are working. This procedure takes approximately one hour. There are no restrictions for this procedure.  Follow up with Dr Anne Fu in 5 to 6 weeks.  Thank you for choosing [facility]!!

## 2014-06-13 NOTE — Progress Notes (Signed)
Cardiology Office Note   Date:  06/13/2014   ID:  Leslie Kline, DOB Feb 23, 1941, MRN [id number]  PCP:  No PCP Per Patient  Cardiologist:  Dr. Anne Fu   Chronic systolic CHF follow up    History of Present Illness: Leslie Kline is a 74 y.o. female with a history of HTN and recent admission 04/18/14 for new onset acute systolic CHF and uncontrolled hyperthyroidism who presents to clinic today for follow up.  She was admitted to [facility] from 04/13/14- 04/21/14 for newly diagnosed systolic CHF. Prior to this she had not seen a medical practitioner in over 6 years and was not taking any medications.  She finally decided to seek medical attention at [facility] on 04/13/2014. Chest x-ray demonstrated a moderate to severe cardiomegaly, moderate right and small left pleural effusion. EKG shows sinus tachycardia with ventricular rate of 120s with prolonged QTC.   2D ECHO on 04/14/2014 revealed EF 20-25%, mild to moderate MR, moderately reduced LVEF, moderate TR/PR, PA peak pressure 54. She was diuresed aggressively for acute systolic heart failure with biventricular dysfunction. Initial TSH was severely depressed. She was noted to have a right-sided goiter. Patient endorse a previous left-sided thyroidectomy in the past. Free T4 was obtained which was elevated. Internal medicine service was consulted for hyperthyroidism. She was placed on methimazole 10 mg 3 times a day. Her heart failure was felt to likely related to her hyperthyroidism resulting in high flow heart failure and eventually biventricular failure. She underwent a scheduled heart catheterization on 04/18/2014 which showed wedge pressure 20 mmHg, cardiac index 1.91, cardiac output 3.5, normal coronaries. She was diuresed further and transition to oral  daily Lasix on 04/20/2014. Given her severely decreased EF and occasional nonsustained VT noted on telemetry, she was thought to be high risk for out of the Kline primary cardiac event and was  placed on LifeVest.  Radioactive iodine therapy was performed for hyperthyroidism. Neck mass noted.  She is feeling much better. No CP, SOB, orthopnea, PND, palpations, dizziness, syncope, tremors. She has chronic LE edema which is drastically improved from previous. She has been taking all of her medications as prescribed and tolerating them well. She has no complaints and is very pleased  to be feeling better.  She is frustrated at times with her life vest. The weight of the battery is cumbersome.    Past Medical History  Diagnosis Date  . Hypertension   . Hypokalemia   . Nonischemic cardiomyopathy     EF 20-25% Jan 2016, cath 04/18/2014 normal coronary. Likely result of uncontrolled hyperthyroidism  . Chronic systolic heart failure   . Goiter     R sided, s/p L side thyroidectomy. R side goiter likely need biopsy in the future  . Nonsustained ventricular tachycardia     no persistent v-tach, asymptomatic, likely related to LV dysfunction, placed on Lifevest  . Prolonged Q-T interval on ECG   . Anemia   . Anasarca     related to combination of heart failure and uncontrolled hyperthyroidism    Past Surgical History  Procedure Laterality Date  . Tubal ligation    . Goiter    . Tonsillectomy    . Abdominal hysterectomy    . Left and right heart catheterization with coronary angiogram N/A 04/18/2014    Procedure: LEFT AND RIGHT HEART CATHETERIZATION WITH CORONARY ANGIOGRAM;  Surgeon: Peter M Swaziland, MD;  Location: [facility] CATH LAB;  Service: Cardiovascular;  Laterality: N/A;     Current Outpatient Prescriptions  Medication Sig Dispense Refill  . aspirin EC 81 MG EC tablet Take 1 tablet (81 mg total) by mouth daily.    . carvedilol (COREG) 12.5 MG tablet Take 1 tablet (12.5 mg total) by mouth 2 (two) times daily with a meal. 60 tablet 5  . furosemide (LASIX) 80 MG tablet Take 1 tablet (80 mg total) by mouth daily. 30 tablet 2  . lisinopril (PRINIVIL,ZESTRIL) 5 MG tablet Take 1 tablet (5  mg total) by mouth daily. 30 tablet 6  . MAGNESIUM-OXIDE 400 (241.3 MG) MG tablet TAKE 1 TABLET BY MOUTH DAILY 30 tablet 0  . potassium chloride SA (K-DUR,KLOR-CON) 20 MEQ tablet Take 2 tablets (40 mEq total) by mouth daily. 60 tablet 2   No current facility-administered medications for this visit.    Allergies:   Review of patient's allergies indicates no known allergies.    Social History:  The patient  reports that she has never smoked. She has never used smokeless tobacco. She reports that she does not drink alcohol or use illicit drugs.   Family History:  The patient's family history includes Diabetes in her maternal grandfather.    ROS:  Please see the history of present illness.  All other systems are reviewed and negative.    PHYSICAL EXAM: VS:  BP 110/68 mmHg  Pulse 87  Ht  (1.626 m)  Wt 160 lb (72.576 kg)  BMI 27.45 kg/m2 , BMI Body mass index is 27.45 kg/(m^2). GEN: Well nourished, well developed, in no acute distress HEENT: normal Neck: no JVD, carotid bruits, right neck mass (hyperthyroid) Cardiac: RRR; no murmurs, rubs, or gallops, 1+ bilaterally pitting  LE edema  Respiratory:  clear to auscultation bilaterally, normal work of breathing GI: soft, nontender, nondistended, + BS MS: no deformity or atrophy Skin: warm and dry, no rash Neuro:  Strength and sensation are intact Psych: euthymic mood, full affect   EKG:   The ekg demonstrates NSR HR 98. RAD.   Recent Labs: 04/13/2014: ALT 8 04/18/2014: B Natriuretic Peptide 1070.1* 04/20/2014: Hemoglobin 10.2*; Platelets 151 04/21/2014: Magnesium 2.0 04/28/2014: BUN 11; Creatinine 0.37*; Potassium 4.2; Sodium 140 06/09/2014: TSH 0.02*    Lipid Panel    Wt Readings from Last 3 Encounters:  06/13/14 160 lb (72.576 kg)  06/09/14 157 lb 3.2 oz (71.305 kg)  05/13/14 159 lb (72.122 kg)      Other studies Reviewed: Additional studies/ records that were reviewed today include: 2D ECHO, RHC. Review of the  above records demonstrates: 2D ECHO on 04/14/2014 revealed EF 20-25%, mild to moderate MR, moderately reduced LVEF, moderate TR/PR, PA peak pressure 54.  Cardiac catheterization 04/18/2014 Procedural Findings: Hemodynamics RA 16/21 mean 17 mm Hg RV 43/17 mm Hg PA 43/22 mean 31 mm Hg PCWP 23/23 mean 20 mm Hg LV 104/20 mm Hg AO 104/71 mean 84 mm Hg  Oxygen saturations: PA 52% AO 95%  Cardiac Output (Fick) 3.5 L/min  Cardiac Index (Fick) 1.91 L/min/meter squared.  Coronary angiography: Coronary dominance: codominant  Left mainstem: Normal  Left anterior descending (LAD): Normal  Left circumflex (LCx): Normal  Right coronary artery (RCA): Normal  Left ventriculography: Not done  Final Conclusions:  1. Normal coronary anatomy 2. Moderate pulmonary HTN with elevated left ventricular filling pressures.  Recommendations: Medical management of CHF.     ASSESSMENT AND PLAN:   Tiaunna Buford is a 74 y.o. female with a history of HTN and recent admission 04/13/14 for new onset acute systolic CHF and uncontrolled hyperthyroidism who  presents to clinic today for follow up.  Chronic systolic CHF-   likely secondary to hyperthyroidism/thyrotoxicosis-2D ECHO on 04/14/2014 revealed EF 20-25%, mild to moderate MR, moderately reduced LVEF, moderate TR/PR, PA peak pressure 54. -- She was placed on LifeVest. She will need a repeat ECHO in 3 month (end April 2016). If EF remains <35%, ICD should be considered if thyroid function remains under good control. Echocardiogram has been ordered. I will see her back after echocardiogram has been performed. -- Continue Coreg 12.5mg  BID and increase Lisinopril from 5 to 10mg  qd. BP stable.   -- Continue Lasix 80mg  qd and Kdur . BMET reassuring.  Hyperthryoidism- sx improved. -- Seen by Dr. Elvera Lennox on 05/1714. 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 Proceeded with RAI tx. Right neck mass noted  HTN- BP well controlled today.  --  Continue coreg 12.5mg  BID, lisinopril increased to 10mg     Current medicines are reviewed at length with the patient today.  The patient does not have concerns regarding medicines.  The following changes have been made:  Increased lisinopril   Disposition:   FU in 1 month after echo read. May need ICD. LifeVest will be taken off at that point.  Mathews Robinsons, MD  06/13/2014 9:48 AM    [facility] [address] [place], [place], [place]  [address] Phone: [phone]; Fax: [phone]

## 2014-06-24 ENCOUNTER — Other Ambulatory Visit: Payer: Self-pay | Admitting: *Deleted

## 2014-06-24 MED ORDER — MAGNESIUM OXIDE 400 (241.3 MG) MG PO TABS: 1.0000 | ORAL_TABLET | Freq: Every day | ORAL | Status: DC

## 2014-07-11 ENCOUNTER — Ambulatory Visit (HOSPITAL_COMMUNITY): Payer: Medicare HMO | Attending: Cardiovascular Disease | Admitting: Cardiology

## 2014-07-11 DIAGNOSIS — I429 Cardiomyopathy, unspecified: Secondary | ICD-10-CM | POA: Diagnosis not present

## 2014-07-11 DIAGNOSIS — R0600 Dyspnea, unspecified: Secondary | ICD-10-CM

## 2014-07-11 DIAGNOSIS — I509 Heart failure, unspecified: Secondary | ICD-10-CM | POA: Diagnosis not present

## 2014-07-11 DIAGNOSIS — I428 Other cardiomyopathies: Secondary | ICD-10-CM

## 2014-07-11 NOTE — Progress Notes (Signed)
Limited echo performed (confirmed limited study with Dr Anne Fu.)

## 2014-07-13 ENCOUNTER — Telehealth: Payer: Self-pay | Admitting: *Deleted

## 2014-07-13 NOTE — Telephone Encounter (Signed)
EF 35-40%. Improved.     Leslie Schultz, MD        ----- Message -----     From: Scarlette Ar     Sent: 07/12/2014 10:43 AM      To: Jake Bathe, MD        ECHO REPORT SCAN IN UNDER ORDER -LEVEL DOCUMENTS     Reviewed results with pt who states understanding.

## 2014-07-14 ENCOUNTER — Other Ambulatory Visit (INDEPENDENT_AMBULATORY_CARE_PROVIDER_SITE_OTHER): Payer: Medicare HMO

## 2014-07-14 DIAGNOSIS — E0590 Thyrotoxicosis, unspecified without thyrotoxic crisis or storm: Secondary | ICD-10-CM

## 2014-07-14 LAB — TSH: TSH: 0.02 u[IU]/mL — AB (ref 0.35–4.50)

## 2014-07-14 LAB — T4, FREE: Free T4: 3.29 ng/dL — ABNORMAL HIGH (ref 0.60–1.60)

## 2014-07-14 LAB — T3, FREE: T3, Free: 6.2 pg/mL — ABNORMAL HIGH (ref 2.3–4.2)

## 2014-07-15 ENCOUNTER — Other Ambulatory Visit: Payer: Self-pay | Admitting: *Deleted

## 2014-07-15 ENCOUNTER — Encounter: Payer: Self-pay | Admitting: *Deleted

## 2014-07-15 DIAGNOSIS — E0590 Thyrotoxicosis, unspecified without thyrotoxic crisis or storm: Secondary | ICD-10-CM

## 2014-07-16 ENCOUNTER — Other Ambulatory Visit: Payer: Self-pay | Admitting: Cardiology

## 2014-07-16 ENCOUNTER — Other Ambulatory Visit: Payer: Self-pay | Admitting: Physician Assistant

## 2014-07-18 ENCOUNTER — Telehealth: Payer: Self-pay | Admitting: Cardiology

## 2014-07-18 NOTE — Telephone Encounter (Signed)
Called Joe with [employer]. Informed him that Dr. Anne Fu and his nurse will be in tomorrow but they are out of the office today. Joe is going to refax the form just in case they do not have the original one. Form placed in Dr. Anne Fu box with patient's facesheet. Routed to Dr. Pascal Lux, RN.

## 2014-07-18 NOTE — Telephone Encounter (Signed)
ERROR

## 2014-07-18 NOTE — Telephone Encounter (Signed)
New Message  Joe wants to make sure that the office has received the Life vest renewal form for this pt. He states that he will fax another order. Please assist

## 2014-07-18 NOTE — Telephone Encounter (Signed)
Sent to wrong pool. \

## 2014-07-18 NOTE — Telephone Encounter (Signed)
Please review for refill, Snyder pt.

## 2014-07-20 NOTE — Telephone Encounter (Signed)
Information below needs to be corrected. Form from [employer]/Joe did not come through in fax, therefore it was not placed into Dr. Anne Fu box. On 4/26, notified Joe at two separate times that form needed to be refaxed with each time separate faxes being identified and the fax # repeated for verification. Form not rec'd as of 4/27 10:33 am.

## 2014-07-25 ENCOUNTER — Ambulatory Visit (INDEPENDENT_AMBULATORY_CARE_PROVIDER_SITE_OTHER): Payer: Medicare HMO | Admitting: Cardiology

## 2014-07-25 ENCOUNTER — Encounter: Payer: Self-pay | Admitting: Cardiology

## 2014-07-25 VITALS — BP 148/60 | HR 84 | Ht 65.0 in | Wt 170.0 lb

## 2014-07-25 DIAGNOSIS — I10 Essential (primary) hypertension: Secondary | ICD-10-CM

## 2014-07-25 DIAGNOSIS — I429 Cardiomyopathy, unspecified: Secondary | ICD-10-CM | POA: Diagnosis not present

## 2014-07-25 DIAGNOSIS — E049 Nontoxic goiter, unspecified: Secondary | ICD-10-CM | POA: Diagnosis not present

## 2014-07-25 DIAGNOSIS — I428 Other cardiomyopathies: Secondary | ICD-10-CM

## 2014-07-25 NOTE — Progress Notes (Signed)
Cardiology Office Note   Date:  07/25/2014   ID:  Avani Sensabaugh, DOB [birth year], MRN [id number]  PCP:  No PCP Per Patient  Cardiologist:  Dr. Anne Fu   Chronic systolic CHF follow up    History of Present Illness: Leslie Kline is a 74 y.o. female with a history of HTN and recent admission 04/18/14 for new onset acute systolic CHF and uncontrolled hyperthyroidism who presents to clinic today for follow up.  She was admitted to [facility] from 04/13/14- 04/21/14 for newly diagnosed systolic CHF. Prior to this she had not seen a medical practitioner in over 6 years and was not taking any medications.  She finally decided to seek medical attention at [facility] on 04/13/2014. Chest x-ray demonstrated a moderate to severe cardiomegaly, moderate right and small left pleural effusion. EKG shows sinus tachycardia with ventricular rate of 120s with prolonged QTC.   2D ECHO on 04/14/2014 revealed EF 20-25%, mild to moderate MR, moderately reduced LVEF, moderate TR/PR, PA peak pressure 54. She was diuresed aggressively for acute systolic heart failure with biventricular dysfunction. Initial TSH was severely depressed. She was noted to have a right-sided goiter. Patient endorse a previous left-sided thyroidectomy in the past. Free T4 was obtained which was elevated. Internal medicine service was consulted for hyperthyroidism. She was placed on methimazole 10 mg 3 times a day. Her heart failure was felt to likely related to her hyperthyroidism resulting in high flow heart failure and eventually biventricular failure. She underwent a scheduled heart catheterization on 04/18/2014 which showed wedge pressure 20 mmHg, cardiac index 1.91, cardiac output 3.5, normal coronaries. She was diuresed further and transition to oral  daily Lasix on 04/20/2014. Given her severely decreased EF and occasional nonsustained VT noted on telemetry, she was thought to be high risk for out of the hospital primary cardiac event and was  placed on LifeVest.  Radioactive iodine therapy was performed for hyperthyroidism. Neck mass noted.  She is feeling much better. No CP, SOB, orthopnea, PND, palpations, dizziness, syncope, tremors. She has chronic LE edema which is drastically improved from previous. She has been taking all of her medications as prescribed and tolerating them well. She has no complaints and is very pleased  to be feeling better.  She is frustrated at times with her life vest. The weight of the battery is cumbersome.   On 07/11/14 and echocardiogram demonstrated ejection fraction improvement of 35-40%. She underwent radioactive iodine.    Past Medical History  Diagnosis Date  . Hypertension   . Hypokalemia   . Nonischemic cardiomyopathy     EF 20-25% Jan 2016, cath 04/18/2014 normal coronary. Likely result of uncontrolled hyperthyroidism  . Chronic systolic heart failure   . Goiter     R sided, s/p L side thyroidectomy. R side goiter likely need biopsy in the future  . Nonsustained ventricular tachycardia     no persistent v-tach, asymptomatic, likely related to LV dysfunction, placed on Lifevest  . Prolonged Q-T interval on ECG   . Anemia   . Anasarca     related to combination of heart failure and uncontrolled hyperthyroidism    Past Surgical History  Procedure Laterality Date  . Tubal ligation    . Goiter    . Tonsillectomy    . Abdominal hysterectomy    . Left and right heart catheterization with coronary angiogram N/A 04/18/2014    Procedure: LEFT AND RIGHT HEART CATHETERIZATION WITH CORONARY ANGIOGRAM;  Surgeon: Peter M Swaziland, MD;  Location:  [facility] CATH LAB;  Service: Cardiovascular;  Laterality: N/A;     Current Outpatient Prescriptions  Medication Sig Dispense Refill  . aspirin EC 81 MG EC tablet Take 1 tablet (81 mg total) by mouth daily.    . carvedilol (COREG) 12.5 MG tablet Take 1 tablet (12.5 mg total) by mouth 2 (two) times daily with a meal. 60 tablet 5  . furosemide (LASIX) 80 MG  tablet TAKE 1 TABLET BY MOUTH EVERY DAY 30 tablet 0  . lisinopril (PRINIVIL,ZESTRIL) 10 MG tablet Take 1 tablet (10 mg total) by mouth daily. 30 tablet 6  . MAGNESIUM-OXIDE 400 (241.3 MG) MG tablet TAKE 1 TABLET(400 MG) BY MOUTH DAILY 30 tablet 0  . potassium chloride SA (K-DUR,KLOR-CON) 20 MEQ tablet TAKE 2 TABLETS BY MOUTH DAILY 60 tablet 0   No current facility-administered medications for this visit.    Allergies:   Review of patient's allergies indicates no known allergies.    Social History:  The patient  reports that she has never smoked. She has never used smokeless tobacco. She reports that she does not drink alcohol or use illicit drugs.   Family History:  The patient's family history includes Diabetes in her maternal grandfather and mother; Heart disease in her mother.    ROS:  Please see the history of present illness.  All other systems are reviewed and negative.    PHYSICAL EXAM: VS:  BP 148/60 mmHg  Pulse 84  Ht  (1.651 m)  Wt 170 lb (77.111 kg)  BMI 28.29 kg/m2 , BMI Body mass index is 28.29 kg/(m^2). GEN: Well nourished, well developed, in no acute distress HEENT: normal Neck: no JVD, carotid bruits, right neck mass (hyperthyroid) Cardiac: RRR; no murmurs, rubs, or gallops, 1+ bilaterally pitting  LE edema  Respiratory:  clear to auscultation bilaterally, normal work of breathing GI: soft, nontender, nondistended, + BS MS: no deformity or atrophy Skin: warm and dry, no rash Neuro:  Strength and sensation are intact Psych: euthymic mood, full affect   EKG:   The ekg demonstrates NSR HR 98. RAD.   Recent Labs: 04/13/2014: ALT 8 04/18/2014: B Natriuretic Peptide 1070.1* 04/20/2014: Hemoglobin 10.2*; Platelets 151 04/21/2014: Magnesium 2.0 04/28/2014: BUN 11; Creatinine 0.37*; Potassium 4.2; Sodium 140 07/14/2014: TSH 0.02*    Lipid Panel    Wt Readings from Last 3 Encounters:  07/25/14 170 lb (77.111 kg)  06/13/14 160 lb (72.576 kg)  06/09/14 157 lb  3.2 oz (71.305 kg)      Other studies Reviewed: Additional studies/ records that were reviewed today include: 2D ECHO, RHC. Review of the above records demonstrates: 2D ECHO on 04/14/2014 revealed EF 20-25%, mild to moderate MR, moderately reduced LVEF, moderate TR/PR, PA peak pressure 54.  Cardiac catheterization 04/18/2014 Procedural Findings: Hemodynamics RA 16/21 mean 17 mm Hg RV 43/17 mm Hg PA 43/22 mean 31 mm Hg PCWP 23/23 mean 20 mm Hg LV 104/20 mm Hg AO 104/71 mean 84 mm Hg  Oxygen saturations: PA 52% AO 95%  Cardiac Output (Fick) 3.5 L/min  Cardiac Index (Fick) 1.91 L/min/meter squared.  Coronary angiography: Coronary dominance: codominant  Left mainstem: Normal  Left anterior descending (LAD): Normal  Left circumflex (LCx): Normal  Right coronary artery (RCA): Normal  Left ventriculography: Not done  Final Conclusions:  1. Normal coronary anatomy 2. Moderate pulmonary HTN with elevated left ventricular filling pressures.  Recommendations: Medical management of CHF.     ASSESSMENT AND PLAN:   Leslie Kline is a 74 y.o.  female with a history of HTN and recent admission 04/13/14 for new onset acute systolic CHF and uncontrolled hyperthyroidism who presents to clinic today for follow up.  Chronic systolic CHF-   likely secondary to hyperthyroidism/thyrotoxicosis-2D ECHO on 04/14/2014 revealed EF 20-25%, mild to moderate MR, moderately reduced LVEF, moderate TR/PR, PA peak pressure 54. Echocardiogram 07/11/14 shows ejection fraction of 35-40%. LifeVest may be discontinued. -- She was placed on LifeVest. May discontinue. -- Continue Coreg 12.5mg  BID and Lisinopril from 10mg  qd. BP stable.   -- Continue Lasix 80mg  qd and Kdur . BMET reassuring.  Hyperthryoidism- sx improved. -- Seen by Dr. Elvera Lennox on 05/1714. 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  Right neck mass noted improved  HTN- BP well controlled today.  -- Continue coreg 12.5mg  BID,  lisinopril increased to 10mg     Current medicines are reviewed at length with the patient today.  The patient does not have concerns regarding medicines.  The following changes have been made: Being LifeVest.   Disposition:   I will see her back in 4 months.   Mathews Robinsons, MD  07/25/2014 9:56 AM    [facility] [address] [place], [place], [place]  [address] Phone: [phone]; Fax: [phone]

## 2014-07-25 NOTE — Patient Instructions (Signed)
Medication Instructions:  None  Labwork: None  Testing/Procedures: None  Follow-Up: Follow up in 4 months with Dr Anne Fu.  You may discontinue wearing your LifeVest.

## 2014-08-19 ENCOUNTER — Other Ambulatory Visit: Payer: Self-pay | Admitting: Cardiology

## 2014-08-23 NOTE — Telephone Encounter (Signed)
Per note 5.2.16  

## 2014-09-02 ENCOUNTER — Other Ambulatory Visit (INDEPENDENT_AMBULATORY_CARE_PROVIDER_SITE_OTHER): Payer: Medicare HMO

## 2014-09-02 DIAGNOSIS — E0590 Thyrotoxicosis, unspecified without thyrotoxic crisis or storm: Secondary | ICD-10-CM | POA: Diagnosis not present

## 2014-09-02 LAB — T4, FREE: Free T4: 5.22 ng/dL — ABNORMAL HIGH (ref 0.60–1.60)

## 2014-09-02 LAB — TSH: TSH: 0.04 u[IU]/mL — ABNORMAL LOW (ref 0.35–4.50)

## 2014-09-02 LAB — T3, FREE: T3, Free: 13.4 pg/mL — ABNORMAL HIGH (ref 2.3–4.2)

## 2014-09-06 ENCOUNTER — Other Ambulatory Visit: Payer: Self-pay | Admitting: Internal Medicine

## 2014-09-06 ENCOUNTER — Other Ambulatory Visit: Payer: Self-pay | Admitting: *Deleted

## 2014-09-06 ENCOUNTER — Encounter: Payer: Self-pay | Admitting: *Deleted

## 2014-09-06 MED ORDER — METHIMAZOLE 5 MG PO TABS: 5.0000 mg | ORAL_TABLET | Freq: Three times a day (TID) | ORAL | Status: DC

## 2014-09-19 ENCOUNTER — Other Ambulatory Visit: Payer: Self-pay | Admitting: Physician Assistant

## 2014-09-19 ENCOUNTER — Other Ambulatory Visit: Payer: Self-pay | Admitting: Cardiology

## 2014-10-10 ENCOUNTER — Encounter: Payer: Self-pay | Admitting: Internal Medicine

## 2014-10-10 ENCOUNTER — Ambulatory Visit (INDEPENDENT_AMBULATORY_CARE_PROVIDER_SITE_OTHER): Payer: Medicare HMO | Admitting: Internal Medicine

## 2014-10-10 VITALS — BP 118/76 | HR 83 | Temp 97.9°F | Resp 12 | Wt 173.0 lb

## 2014-10-10 DIAGNOSIS — E049 Nontoxic goiter, unspecified: Secondary | ICD-10-CM | POA: Diagnosis not present

## 2014-10-10 DIAGNOSIS — E0590 Thyrotoxicosis, unspecified without thyrotoxic crisis or storm: Secondary | ICD-10-CM | POA: Diagnosis not present

## 2014-10-10 LAB — T4, FREE: Free T4: 4.68 ng/dL — ABNORMAL HIGH (ref 0.60–1.60)

## 2014-10-10 LAB — T3, FREE: T3, Free: 14 pg/mL — ABNORMAL HIGH (ref 2.3–4.2)

## 2014-10-10 LAB — TSH: TSH: 0.08 u[IU]/mL — AB (ref 0.35–4.50)

## 2014-10-10 MED ORDER — METHIMAZOLE 5 MG PO TABS: 5.0000 mg | ORAL_TABLET | Freq: Two times a day (BID) | ORAL | Status: DC

## 2014-10-10 NOTE — Patient Instructions (Signed)
Please stop at the lab.  Please stay off Methimazole for now.  Please come back for another appt in 4 months.

## 2014-10-10 NOTE — Progress Notes (Addendum)
Patient ID: Leslie Kline, female   DOB: 06-Nov-1940, 74 y.o.   MRN: [id number]   HPI  Leslie Kline is a 74 y.o.-year-old female, initially referred by the Hospitalist team, Dr. Isidoro Donning, for management of thyrotoxicosis and goiter. Last visit 04/2014. No PCP.  Reviewed hx: She noticed SOB for ~3 month. Went to the hospital 04/13/2014 for this >> found to be thyrotoxic and having CHF with an EF 20-25%.   Patient has a history of previous left hemithyroidectomy - 20-25 years ago - ? dx. She had a recent thyroid ultrasound (04/16/2014) showing a large right lobe heterogeneous nodule. The size of the right lobe is [phone] cm. There are small areas of microcalcifications scattered throughout the nodule, which is predominantly solid. Blood flow was identified throughout areas of the nodule.  She was started on MMI 10 mg 3x a day - stopped 4 days prior to the RAI tx.  She had RAI tx (15.7 mCi) on 05/16/2014.  However, I advised her to restart MMI 5 mg tid as TFTs were still abnormal at last check 1 mo ago >> she did not get the message!  I reviewed pt's thyroid tests:  Lab Results  Component Value Date   TSH 0.04* 09/02/2014   TSH 0.02* 07/14/2014   TSH 0.02* 06/09/2014   TSH 0.046* 04/13/2014   TSH 0.013* 04/13/2014   FREET4 5.22* 09/02/2014   FREET4 3.29* 07/14/2014   FREET4 4.25* 06/09/2014   FREET4 5.36* 04/14/2014    Component     Latest Ref Rng 04/15/2014  Thyrotropin Receptor Ab     <=16.0 % 77.8 (H)   She is also on Coreg 12.5 mg 2x a day.  Pt denies feeling nodules in neck, hoarseness, dysphagia/odynophagia, SOB with lying down. She feels her goiter is decreasing in size.  She c/o: - + weight gain : 13 lbs since RAI tx, previously weight loss: previously lost ~100 lbs in last 6 mo - + fatigue - + excessive sweating/heat intolerance, but improving - + a little improved tremors - resolved anxiety - no palpitations - no hyperdefecation  She also has a history of congestive  heart failure.  ROS: Constitutional: See HPI, + nocturia Eyes: no blurry vision, no xerophthalmia ENT: no sore throat, + nodule palpated in R throat, no dysphagia/odynophagia, no hoarseness Cardiovascular: no CP/SOB/no palpitations/L>R leg swelling Respiratory: no cough/SOB Gastrointestinal: no N/V/D/C Musculoskeletal: no muscle/joint aches Skin: no rashes, + itching Neurological: no tremors/numbness/tingling/dizziness  I reviewed pt's medications, allergies, PMH, social hx, family hx, and changes were documented in the history of present illness. Otherwise, unchanged from my initial visit note:  Past Medical History  Diagnosis Date  . Hypertension   . Hypokalemia   . Nonischemic cardiomyopathy     EF 20-25% Jan 2016, cath 04/18/2014 normal coronary. Likely result of uncontrolled hyperthyroidism  . Chronic systolic heart failure   . Goiter     R sided, s/p L side thyroidectomy. R side goiter likely need biopsy in the future  . Nonsustained ventricular tachycardia     no persistent v-tach, asymptomatic, likely related to LV dysfunction, placed on Lifevest  . Prolonged Q-T interval on ECG   . Anemia   . Anasarca     related to combination of heart failure and uncontrolled hyperthyroidism   Past Surgical History  Procedure Laterality Date  . Tubal ligation    . Goiter    . Tonsillectomy    . Abdominal hysterectomy    . Left and right heart catheterization  with coronary angiogram N/A 04/18/2014    Procedure: LEFT AND RIGHT HEART CATHETERIZATION WITH CORONARY ANGIOGRAM;  Surgeon: Peter M Swaziland, MD;  Location: [facility] CATH LAB;  Service: Cardiovascular;  Laterality: N/A;   History   Social History  . Marital Status: widowed    Spouse Name: N/A    Number of Children: 8   .   Social History Main Topics  . Smoking status: Never Smoker   . Smokeless tobacco: Never Used  . Alcohol Use: No  . Drug Use: No    Current Outpatient Prescriptions on File Prior to Visit  Medication  Sig Dispense Refill  . aspirin EC 81 MG EC tablet Take 1 tablet (81 mg total) by mouth daily.    . carvedilol (COREG) 12.5 MG tablet TAKE 1 TABLET BY MOUTH TWICE DAILY WITH A MEAL 180 tablet 5  . furosemide (LASIX) 80 MG tablet TAKE 1 TABLET BY MOUTH EVERY DAY 30 tablet 5  . lisinopril (PRINIVIL,ZESTRIL) 10 MG tablet Take 1 tablet (10 mg total) by mouth daily. 30 tablet 6  . MAGNESIUM-OXIDE 400 (241.3 MG) MG tablet TAKE 1 TABLET(400 MG) BY MOUTH DAILY 30 tablet 0  . methimazole (TAPAZOLE) 5 MG tablet TAKE 1 TABLET(5 MG) BY MOUTH THREE TIMES DAILY 270 tablet 1  . potassium chloride SA (K-DUR,KLOR-CON) 20 MEQ tablet TAKE 2 TABLETS BY MOUTH DAILY 180 tablet 0   No current facility-administered medications on file prior to visit.   No Known Allergies Family History  Problem Relation Age of Onset  . Diabetes Maternal Grandfather   . Heart disease Mother   . Diabetes Mother    PE: BP 118/76 mmHg  Pulse 83  Temp(Src) 97.9 F (36.6 C) (Oral)  Resp 12  Wt 173 lb (78.472 kg)  SpO2 97% Body mass index is 28.79 kg/(m^2). Wt Readings from Last 3 Encounters:  10/10/14 173 lb (78.472 kg)  07/25/14 170 lb (77.111 kg)  06/13/14 160 lb (72.576 kg)   Constitutional: overweight, in NAD Eyes: PERRLA, EOMI, + mild exophthalmos, no lid lag, mild stare ENT: moist mucous membranes, + R thyromegaly and palpable left thyroid lobe, no cervical lymphadenopathy Cardiovascular: RRR, + 1/6 SEM, no RG, + periankle LLE swelling Respiratory: CTA B Gastrointestinal: abdomen soft, NT, ND, BS+ Musculoskeletal: no deformities, strength intact in all 4 Skin: moist, warm, no rashes Neurological: + tremor with outstretched hands and head, DTR normal in all 4  ASSESSMENT: 1. 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 Large R thyroid nodule  PLAN:  1. Patient with h/o thyrotoxicosis, previously with thyrotoxic sxs: weight loss, heat intolerance, hyperdefecation, palpitations, anxiety. I suspected Graves  ds based on timeline and severity of the ds and the fact that she had positive TRAb >> we skipped the Uptake and scan. Due to the severity of her ds (complicated by CHF), I suggested either RAI tx or surgery. She refused sx. >> had RAI tx 05/16/2014. Sxs are mostly resolved, except for persistent tremors.  - TFTs post RAI tx. Still abnormal, only mildly improved at last check, 4 mo post RAItx - she did not get the msg to restart MMI... - will recheck TFTs today >> I hope not to need to restart MMI - will continue her beta blocker - this is per cardiology - RTC in 4 months, but for repeat labs in 5-6 weeks.  2. Right thyroid nodule - Patient had a thyroid ultrasound in 03/2014 showing an enlarged right thyroid lobe, which is virtually replaced by  a right thyroid nodule. At that point, the patient was thyrotoxic.  - we likely need to repeat the ultrasound at next visit, but it is encouraging that pt feels that her goiter is decreasing  Daughter's phone nr: [phone] - to call with results  Labs obtained after RAI tx are shown below - last set of labs is checked after starting MMI 5 mg bid: Component     Latest Ref Rng 06/09/2014 07/14/2014 09/02/2014 10/10/2014 11/22/2014  TSH     0.35 - 4.50 uIU/mL 0.02 (L) 0.02 (L) 0.04 (L) 0.08 (L) 0.07 (L)  Free T4     0.60 - 1.60 ng/dL 0.98 (H) 1.19 (H) 1.47 (H) 4.68 (H) 1.22  T3, Free     2.3 - 4.2 pg/mL 9.6 (H) 6.2 (H) 13.4 (H) 14.0 (H) 3.5  TFTs much improved after starting MMI (normal fT4 and fT3, TSH usually lags behind the rest of the labs)- continue current MMI dose >> will recheck labs at next visit in 1.5 mo.

## 2014-11-14 ENCOUNTER — Other Ambulatory Visit: Payer: Self-pay | Admitting: Cardiology

## 2014-11-22 ENCOUNTER — Other Ambulatory Visit (INDEPENDENT_AMBULATORY_CARE_PROVIDER_SITE_OTHER): Payer: Medicare HMO

## 2014-11-22 DIAGNOSIS — E0590 Thyrotoxicosis, unspecified without thyrotoxic crisis or storm: Secondary | ICD-10-CM

## 2014-11-22 LAB — T4, FREE: Free T4: 1.22 ng/dL (ref 0.60–1.60)

## 2014-11-22 LAB — T3, FREE: T3, Free: 3.5 pg/mL (ref 2.3–4.2)

## 2014-11-22 LAB — TSH: TSH: 0.07 u[IU]/mL — AB (ref 0.35–4.50)

## 2014-11-25 ENCOUNTER — Ambulatory Visit (INDEPENDENT_AMBULATORY_CARE_PROVIDER_SITE_OTHER): Payer: Medicare HMO | Admitting: Cardiology

## 2014-11-25 ENCOUNTER — Encounter: Payer: Self-pay | Admitting: Cardiology

## 2014-11-25 VITALS — BP 136/78 | HR 54 | Ht 62.0 in | Wt 181.8 lb

## 2014-11-25 DIAGNOSIS — I428 Other cardiomyopathies: Secondary | ICD-10-CM

## 2014-11-25 DIAGNOSIS — I270 Primary pulmonary hypertension: Secondary | ICD-10-CM

## 2014-11-25 DIAGNOSIS — I10 Essential (primary) hypertension: Secondary | ICD-10-CM

## 2014-11-25 DIAGNOSIS — I272 Pulmonary hypertension, unspecified: Secondary | ICD-10-CM

## 2014-11-25 DIAGNOSIS — I429 Cardiomyopathy, unspecified: Secondary | ICD-10-CM

## 2014-11-25 DIAGNOSIS — IMO0001 Reserved for inherently not codable concepts without codable children: Secondary | ICD-10-CM

## 2014-11-25 DIAGNOSIS — E049 Nontoxic goiter, unspecified: Secondary | ICD-10-CM

## 2014-11-25 DIAGNOSIS — Z0389 Encounter for observation for other suspected diseases and conditions ruled out: Secondary | ICD-10-CM | POA: Diagnosis not present

## 2014-11-25 DIAGNOSIS — E876 Hypokalemia: Secondary | ICD-10-CM

## 2014-11-25 LAB — BASIC METABOLIC PANEL
BUN: 22 mg/dL (ref 6–23)
CALCIUM: 9.4 mg/dL (ref 8.4–10.5)
CO2: 31 mEq/L (ref 19–32)
Chloride: 106 mEq/L (ref 96–112)
Creatinine, Ser: 0.6 mg/dL (ref 0.40–1.20)
GFR: 126.09 mL/min (ref >60.00)
Glucose, Bld: 98 mg/dL (ref 70–99)
Potassium: 4.6 mEq/L (ref 3.5–5.1)
SODIUM: 143 meq/L (ref 135–145)

## 2014-11-25 NOTE — Patient Instructions (Signed)
Medication Instructions:  Your physician recommends that you continue on your current medications as directed. Please refer to the Current Medication list given to you today.  Labwork: Please have blood work today (BMP)  Follow-Up: Follow up in 4 months with Dr. Anne Fu.  You will receive a letter in the mail 2 months before you are due.  Please call us when you receive this letter to schedule your follow up appointment.  Thank you for choosing [facility]!!

## 2014-11-25 NOTE — Progress Notes (Signed)
Cardiology Office Note   Date:  11/25/2014   ID:  Leslie Kline, DOB July 09, 1940, MRN [id number]  PCP:  No PCP Per Patient  Cardiologist:  Dr. Anne Fu   Chronic systolic CHF follow up    History of Present Illness: Leslie Kline is a 74 y.o. female with a history of HTN and recent admission 04/18/14 for new onset acute systolic CHF and uncontrolled hyperthyroidism who presents to clinic today for follow up.  She was admitted to [facility] from 04/13/14- 04/21/14 for newly diagnosed systolic CHF. Prior to this she had not seen a medical practitioner in over 6 years and was not taking any medications.  She finally decided to seek medical attention at [facility] on 04/13/2014. Chest x-ray demonstrated a moderate to severe cardiomegaly, moderate right and small left pleural effusion. EKG shows sinus tachycardia with ventricular rate of 120s with prolonged QTC.   2D ECHO on 04/14/2014 revealed EF 20-25%, mild to moderate MR, moderately reduced LVEF, moderate TR/PR, PA peak pressure 54. She was diuresed aggressively for acute systolic heart failure with biventricular dysfunction. Initial TSH was severely depressed. She was noted to have a right-sided goiter. Patient endorse a previous left-sided thyroidectomy in the past. Free T4 was obtained which was elevated. Internal medicine service was consulted for hyperthyroidism. She was placed on methimazole 10 mg 3 times a day. Her heart failure was felt to likely related to her hyperthyroidism resulting in high flow heart failure and eventually biventricular failure. She underwent a scheduled heart catheterization on 04/18/2014 which showed wedge pressure 20 mmHg, cardiac index 1.91, cardiac output 3.5, normal coronaries. She was diuresed further and transition to oral 80mg  daily Lasix on 04/20/2014. Given her severely decreased EF and occasional nonsustained VT noted on telemetry, she was thought to be high risk for out of the hospital primary cardiac event and was  placed on LifeVest.  Radioactive iodine therapy was performed for hyperthyroidism. Neck mass noted.  She is feeling much better. No CP, SOB, orthopnea, PND, palpations, dizziness, syncope, tremors. She has chronic LE edema which is drastically improved from previous. She has been taking all of her medications as prescribed and tolerating them well. She has no complaints and is very pleased  to be feeling better.    On 07/11/14 and echocardiogram demonstrated ejection fraction improvement of 35-40%. She underwent radioactive iodine.   She is quite active. She has gained about 10 pounds back. She's not feeling any significant shortness of breath. She is working a few hours a day. Much improved. No 60. No orthopnea.    Past Medical History  Diagnosis Date  . Hypertension   . Hypokalemia   . Nonischemic cardiomyopathy     EF 20-25% Jan 2016, cath 04/18/2014 normal coronary. Likely result of uncontrolled hyperthyroidism  . Chronic systolic heart failure   . Goiter     R sided, s/p L side thyroidectomy. R side goiter likely need biopsy in the future  . Nonsustained ventricular tachycardia     no persistent v-tach, asymptomatic, likely related to LV dysfunction, placed on Lifevest  . Prolonged Q-T interval on ECG   . Anemia   . Anasarca     related to combination of heart failure and uncontrolled hyperthyroidism    Past Surgical History  Procedure Laterality Date  . Tubal ligation    . Goiter    . Tonsillectomy    . Abdominal hysterectomy    . Left and right heart catheterization with coronary angiogram N/A 04/18/2014  Procedure: LEFT AND RIGHT HEART CATHETERIZATION WITH CORONARY ANGIOGRAM;  Surgeon: Peter M Swaziland, MD;  Location: [facility] CATH LAB;  Service: Cardiovascular;  Laterality: N/A;     Current Outpatient Prescriptions  Medication Sig Dispense Refill  . aspirin EC 81 MG EC tablet Take 1 tablet (81 mg total) by mouth daily.    . carvedilol (COREG) 12.5 MG tablet TAKE 1 TABLET BY  MOUTH TWICE DAILY WITH A MEAL 180 tablet 5  . furosemide (LASIX) 80 MG tablet TAKE 1 TABLET BY MOUTH EVERY DAY 30 tablet 5  . lisinopril (PRINIVIL,ZESTRIL) 10 MG tablet Take 1 tablet (10 mg total) by mouth daily. 30 tablet 6  . MAGNESIUM-OXIDE 400 (241.3 MG) MG tablet TAKE 1 TABLET(400 MG) BY MOUTH DAILY 30 tablet 0  . methimazole (TAPAZOLE) 5 MG tablet Take 1 tablet (5 mg total) by mouth 2 (two) times daily. 270 tablet 1  . potassium chloride SA (K-DUR,KLOR-CON) 20 MEQ tablet TAKE 2 TABLETS BY MOUTH DAILY 180 tablet 0   No current facility-administered medications for this visit.    Allergies:   Review of patient's allergies indicates no known allergies.    Social History:  The patient  reports that she has never smoked. She has never used smokeless tobacco. She reports that she does not drink alcohol or use illicit drugs.   Family History:  The patient's family history includes Diabetes in her maternal grandfather and mother; Heart disease in her mother.    ROS:  Please see the history of present illness.  All other systems are reviewed and negative.    PHYSICAL EXAM: VS:  BP 136/78 mmHg  Pulse 54  Ht  (1.575 m)  Wt 181 lb 12.8 oz (82.464 kg)  BMI 33.24 kg/m2  SpO2 96% , BMI Body mass index is 33.24 kg/(m^2). GEN: Well nourished, well developed, in no acute distress HEENT: right neck mass Neck: no JVD, carotid bruits, right neck mass (hyperthyroid) Cardiac: RRR; no murmurs, rubs, or gallops, 1+ bilaterally pitting  LE edema  Respiratory:  clear to auscultation bilaterally, normal work of breathing GI: soft, nontender, nondistended, + BS MS: no deformity or atrophy Skin: warm and dry, no rash Neuro:  Strength and sensation are intact Psych: euthymic mood, full affect   EKG:   The ekg demonstrates NSR HR 98. RAD.   Recent Labs: 04/13/2014: ALT 8 04/18/2014: B Natriuretic Peptide 1070.1* 04/20/2014: Hemoglobin 10.2*; Platelets 151 04/21/2014: Magnesium 2.0 04/28/2014:  BUN 11; Creatinine, Ser 0.37*; Potassium 4.2; Sodium 140 11/22/2014: TSH 0.07*    Lipid Panel    Wt Readings from Last 3 Encounters:  11/25/14 181 lb 12.8 oz (82.464 kg)  10/10/14 173 lb (78.472 kg)  07/25/14 170 lb (77.111 kg)      Other studies Reviewed: Additional studies/ records that were reviewed today include: 2D ECHO, RHC. Review of the above records demonstrates: 2D ECHO on 04/14/2014 revealed EF 20-25%, mild to moderate MR, moderately reduced LVEF, moderate TR/PR, PA peak pressure 54.  Cardiac catheterization 04/18/2014 Procedural Findings: Hemodynamics RA 16/21 mean 17 mm Hg RV 43/17 mm Hg PA 43/22 mean 31 mm Hg PCWP 23/23 mean 20 mm Hg LV 104/20 mm Hg AO 104/71 mean 84 mm Hg  Oxygen saturations: PA 52% AO 95%  Cardiac Output (Fick) 3.5 L/min  Cardiac Index (Fick) 1.91 L/min/meter squared.  Coronary angiography: Coronary dominance: codominant  Left mainstem: Normal  Left anterior descending (LAD): Normal  Left circumflex (LCx): Normal  Right coronary artery (RCA): Normal  Left  ventriculography: Not done  Final Conclusions:  1. Normal coronary anatomy 2. Moderate pulmonary HTN with elevated left ventricular filling pressures.  Recommendations: Medical management of CHF.     ASSESSMENT AND PLAN:   Leslie Kline is a 74 y.o. female with a history of HTN and recent admission 04/13/14 for new onset acute systolic CHF and uncontrolled hyperthyroidism who presents to clinic today for follow up.  Chronic systolic CHF-   likely secondary to hyperthyroidism/thyrotoxicosis-2D ECHO on 04/14/2014 revealed EF 20-25%, mild to moderate MR, moderately reduced LVEF, moderate TR/PR, PA peak pressure 54. Echocardiogram 07/11/14 shows ejection fraction of 35-40%.   -- Continue Coreg 12.5mg  BID and Lisinopril from 10mg  qd. BP stable.   -- Continue Lasix 80mg  qd and Kdur . BMET previously reassuring. Recheck  Hyperthryoidism- sx improved. -- Seen by Dr.  Elvera Lennox on 05/1714. 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  Right neck mass noted improved. Labs improved  HTN- BP well controlled today.  -- Continue coreg 12.5mg  BID, lisinopril increased to 10mg     Current medicines are reviewed at length with the patient today.  The patient does not have concerns regarding medicines.    Disposition:   I will see her back in 4 months.   Mathews Robinsons, MD  11/25/2014 9:29 AM    [facility] [address] [place], [place], [place]  [address] Phone: [phone]; Fax: [phone]

## 2014-12-21 ENCOUNTER — Other Ambulatory Visit: Payer: Self-pay | Admitting: Cardiology

## 2015-01-16 ENCOUNTER — Other Ambulatory Visit: Payer: Self-pay | Admitting: Cardiology

## 2015-02-10 ENCOUNTER — Encounter: Payer: Self-pay | Admitting: Internal Medicine

## 2015-02-10 ENCOUNTER — Ambulatory Visit (INDEPENDENT_AMBULATORY_CARE_PROVIDER_SITE_OTHER): Payer: Medicare HMO | Admitting: Internal Medicine

## 2015-02-10 VITALS — BP 120/78 | HR 71 | Temp 98.2°F | Wt 199.0 lb

## 2015-02-10 DIAGNOSIS — E0590 Thyrotoxicosis, unspecified without thyrotoxic crisis or storm: Secondary | ICD-10-CM | POA: Diagnosis not present

## 2015-02-10 DIAGNOSIS — E049 Nontoxic goiter, unspecified: Secondary | ICD-10-CM | POA: Diagnosis not present

## 2015-02-10 LAB — T3, FREE: T3, Free: 3.4 pg/mL (ref 2.3–4.2)

## 2015-02-10 LAB — TSH: TSH: 0.04 u[IU]/mL — ABNORMAL LOW (ref 0.35–4.50)

## 2015-02-10 LAB — T4, FREE: Free T4: 0.98 ng/dL (ref 0.60–1.60)

## 2015-02-10 NOTE — Progress Notes (Signed)
Patient ID: Leslie Kline, female   DOB: 04-27-1940, 74 y.o.   MRN: [id number]   HPI  Leslie Kline is a 74 y.o.-year-old female, initially referred by the Hospitalist team, Dr. Isidoro Donning, for management of thyrotoxicosis and goiter. Last visit 09/2014. No PCP.  Reviewed hx: She noticed SOB for ~3 month. Went to the hospital 04/13/2014 for this >> found to be thyrotoxic and having CHF with an EF 20-25%.   Patient has a history of previous left hemithyroidectomy - 20-25 years ago - ? dx. She had a recent thyroid ultrasound (04/16/2014) showing a large right lobe heterogeneous nodule. The size of the right lobe is [phone] cm. There are small areas of microcalcifications scattered throughout the nodule, which is predominantly solid. Blood flow was identified throughout areas of the nodule.  She was started on MMI 10 mg 3x a day - stopped 4 days prior to the RAI tx.  She had RAI tx (15.7 mCi) on 05/16/2014.  However, we restarted MMI 5 mg BID as TFTs were still abnormal. She continues this dose.   She feels her R thyroid nodule has decreased in size since the RAI tx.   I reviewed pt's thyroid tests:  Lab Results  Component Value Date   TSH 0.07* 11/22/2014   TSH 0.08* 10/10/2014   TSH 0.04* 09/02/2014   TSH 0.02* 07/14/2014   TSH 0.02* 06/09/2014   TSH 0.046* 04/13/2014   TSH 0.013* 04/13/2014   FREET4 1.22 11/22/2014   FREET4 4.68* 10/10/2014   FREET4 5.22* 09/02/2014   FREET4 3.29* 07/14/2014   FREET4 4.25* 06/09/2014   FREET4 5.36* 04/14/2014    Component     Latest Ref Rng 04/15/2014  Thyrotropin Receptor Ab     <=16.0 % 77.8 (H)   She is also on Coreg 12.5 mg 2x a day.  Pt denies feeling nodules in neck, hoarseness, dysphagia/odynophagia, SOB with lying down. She feels her goiter is decreasing in size.  She c/o: - + weight gain: previously weight loss: previously lost ~100 lbs in last 6 mo - no fatigue - no excessive sweating/heat intolerance, but improving - + improved  tremors - resolved anxiety - no palpitations - no hyperdefecation  She also has a history of congestive heart failure.  ROS: Constitutional: See HPI, + nocturia Eyes: no blurry vision, no xerophthalmia ENT: no sore throat, + nodule palpated in R throat, no dysphagia/odynophagia, no hoarseness Cardiovascular: no CP/SOB/no palpitations/no leg swelling Respiratory: no cough/SOB Gastrointestinal: no N/V/D/C Musculoskeletal: no muscle/joint aches Skin: no rashes Neurological: no tremors/numbness/tingling/dizziness  I reviewed pt's medications, allergies, PMH, social hx, family hx, and changes were documented in the history of present illness. Otherwise, unchanged from my initial visit note:  Past Medical History  Diagnosis Date  . Hypertension   . Hypokalemia   . Nonischemic cardiomyopathy (HCC)     EF 20-25% Jan 2016, cath 04/18/2014 normal coronary. Likely result of uncontrolled hyperthyroidism  . Chronic systolic heart failure   . Goiter     R sided, s/p L side thyroidectomy. R side goiter likely need biopsy in the future  . Nonsustained ventricular tachycardia     no persistent v-tach, asymptomatic, likely related to LV dysfunction, placed on Lifevest  . Prolonged Q-T interval on ECG   . Anemia   . Anasarca     related to combination of heart failure and uncontrolled hyperthyroidism   Past Surgical History  Procedure Laterality Date  . Tubal ligation    . Goiter    .  Tonsillectomy    . Abdominal hysterectomy    . Left and right heart catheterization with coronary angiogram N/A 04/18/2014    Procedure: LEFT AND RIGHT HEART CATHETERIZATION WITH CORONARY ANGIOGRAM;  Surgeon: Peter M Swaziland, MD;  [facility] CATH LAB;  Service: Cardiovascular;  Laterality: N/A;   History   Social History  . Marital Status: widowed    Spouse Name: N/A    Number of Children: 8   .   Social History Main Topics  . Smoking status: Never Smoker   . Smokeless tobacco: Never Used  . Alcohol  Use: No  . Drug Use: No    Current Outpatient Prescriptions on File Prior to Visit  Medication Sig Dispense Refill  . aspirin EC 81 MG EC tablet Take 1 tablet (81 mg total) by mouth daily.    . carvedilol (COREG) 12.5 MG tablet TAKE 1 TABLET BY MOUTH TWICE DAILY WITH A MEAL 180 tablet 5  . furosemide (LASIX) 80 MG tablet TAKE 1 TABLET BY MOUTH EVERY DAY 30 tablet 5  . lisinopril (PRINIVIL,ZESTRIL) 10 MG tablet TAKE 1 TABLET(10 MG) BY MOUTH DAILY 30 tablet 10  . MAGNESIUM-OXIDE 400 (241.3 MG) MG tablet TAKE 1 TABLET(400 MG) BY MOUTH DAILY 30 tablet 0  . methimazole (TAPAZOLE) 5 MG tablet Take 1 tablet (5 mg total) by mouth 2 (two) times daily. 270 tablet 1  . potassium chloride SA (K-DUR,KLOR-CON) 20 MEQ tablet TAKE 2 TABLETS BY MOUTH DAILY 180 tablet 3   No current facility-administered medications on file prior to visit.   No Known Allergies Family History  Problem Relation Age of Onset  . Diabetes Maternal Grandfather   . Heart disease Mother   . Diabetes Mother    PE: BP 120/78 mmHg  Pulse 71  Temp(Src) 98.2 F (36.8 C) (Oral)  Wt 199 lb (90.266 kg) Body mass index is 36.39 kg/(m^2). Wt Readings from Last 3 Encounters:  02/10/15 199 lb (90.266 kg)  11/25/14 181 lb 12.8 oz (82.464 kg)  10/10/14 173 lb (78.472 kg)   Constitutional: overweight, in NAD Eyes: PERRLA, EOMI, + mild exophthalmos, no lid lag, mild stare ENT: moist mucous membranes, + R thyromegaly, no cervical lymphadenopathy Cardiovascular: RRR, + 1/6 SEM, no RG, no LE swelling Respiratory: CTA B Gastrointestinal: abdomen soft, NT, ND, BS+ Musculoskeletal: no deformities, strength intact in all 4 Skin: moist, warm, no rashes Neurological: + tremor with outstretched hands (L >R) and head, DTR normal in all 4  ASSESSMENT: 1. 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 Large R thyroid nodule  PLAN:  1. Patient with h/o thyrotoxicosis, previously with thyrotoxic sxs: weight loss, heat intolerance,  hyperdefecation, palpitations, anxiety. 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 Due to the severity of her ds (complicated by CHF), I suggested either RAI tx or surgery. She refused sx. >> had RAI tx 05/16/2014. Sxs are mostly resolved, except for persistent tremors. Her TFTs did not normalized after the surgery >> we had to start MMI 5 mg bid.  - will recheck TFTs today, I hope we can taper MMI to off. We have to absolutely avoid thyrotoxicosis 2/2 her CHF - will continue her beta blocker - this is per cardiology - RTC in 4 months, but for repeat labs in 5-6 weeks.  2. Right thyroid nodule - Patient had a thyroid ultrasound in 03/2014 showing an enlarged right  thyroid lobe, which is virtually replaced by a right thyroid nodule. At that point, the patient was thyrotoxic.  - pt feels that her goiter is decreasing, which is great - we may need to repeat a thyroid U/S in the future, after she becomes euthyroid  Daughter's phone nr: [phone] - to call with results  Component     Latest Ref Rng 02/10/2015          TSH     0.35 - 4.50 uIU/mL 0.04 (L)  Free T4     0.60 - 1.60 ng/dL 9.62  T3, Free     2.3 - 4.2 pg/mL 3.4  I am surprised about her persistently low TSH... She called [place] back after the visit and confirmed that she is taking the MMI bid. The rest of the labs are normal. She gained quite a bit of weight and she also does not appear hyperthyroid (no tachycardia, increased tremors). I will continue current MMI dose and have her back for labs in 2 months.

## 2015-02-10 NOTE — Patient Instructions (Addendum)
Please stop at the lab.  Continue Methimazole 5 mg 2x a day with meals. Please call me when you get home to tell me is you have this among your medication bottles.  Please return in 4 months.

## 2015-02-10 NOTE — Progress Notes (Signed)
Pre visit review using our clinic review tool, if applicable. No additional management support is needed unless otherwise documented below in the visit note. 

## 2015-02-13 ENCOUNTER — Other Ambulatory Visit: Payer: Self-pay | Admitting: Cardiology

## 2015-02-14 ENCOUNTER — Other Ambulatory Visit: Payer: Self-pay | Admitting: Cardiology

## 2015-02-14 ENCOUNTER — Other Ambulatory Visit: Payer: Self-pay | Admitting: *Deleted

## 2015-02-14 DIAGNOSIS — E0590 Thyrotoxicosis, unspecified without thyrotoxic crisis or storm: Secondary | ICD-10-CM

## 2015-02-15 ENCOUNTER — Telehealth: Payer: Self-pay | Admitting: Internal Medicine

## 2015-02-15 MED ORDER — METHIMAZOLE 5 MG PO TABS: 5.0000 mg | ORAL_TABLET | Freq: Two times a day (BID) | ORAL | Status: DC

## 2015-02-15 NOTE — Telephone Encounter (Signed)
Pt needs new rx for the methimazole to be called into [facility] please 5 mg tab 2 times daily

## 2015-03-21 ENCOUNTER — Other Ambulatory Visit: Payer: Self-pay | Admitting: Cardiology

## 2015-03-22 ENCOUNTER — Other Ambulatory Visit: Payer: Self-pay | Admitting: Internal Medicine

## 2015-04-25 ENCOUNTER — Ambulatory Visit (INDEPENDENT_AMBULATORY_CARE_PROVIDER_SITE_OTHER): Payer: Medicare Other | Admitting: Cardiology

## 2015-04-25 ENCOUNTER — Encounter: Payer: Self-pay | Admitting: Cardiology

## 2015-04-25 VITALS — BP 128/72 | HR 64 | Ht 62.0 in | Wt 208.4 lb

## 2015-04-25 DIAGNOSIS — Z79899 Other long term (current) drug therapy: Secondary | ICD-10-CM

## 2015-04-25 DIAGNOSIS — I509 Heart failure, unspecified: Secondary | ICD-10-CM

## 2015-04-25 DIAGNOSIS — I10 Essential (primary) hypertension: Secondary | ICD-10-CM

## 2015-04-25 DIAGNOSIS — I429 Cardiomyopathy, unspecified: Secondary | ICD-10-CM | POA: Diagnosis not present

## 2015-04-25 DIAGNOSIS — E049 Nontoxic goiter, unspecified: Secondary | ICD-10-CM

## 2015-04-25 DIAGNOSIS — Z0389 Encounter for observation for other suspected diseases and conditions ruled out: Secondary | ICD-10-CM

## 2015-04-25 DIAGNOSIS — I5082 Biventricular heart failure: Secondary | ICD-10-CM

## 2015-04-25 DIAGNOSIS — I428 Other cardiomyopathies: Secondary | ICD-10-CM

## 2015-04-25 DIAGNOSIS — IMO0001 Reserved for inherently not codable concepts without codable children: Secondary | ICD-10-CM

## 2015-04-25 LAB — BASIC METABOLIC PANEL
BUN: 18 mg/dL (ref 7–25)
CALCIUM: 8.6 mg/dL (ref 8.6–10.4)
CO2: 28 mmol/L (ref 20–31)
CREATININE: 0.73 mg/dL (ref 0.60–0.93)
Chloride: 104 mmol/L (ref 98–110)
Glucose, Bld: 90 mg/dL (ref 65–99)
Potassium: 3.9 mmol/L (ref 3.5–5.3)
SODIUM: 139 mmol/L (ref 135–146)

## 2015-04-25 NOTE — Patient Instructions (Signed)
Medication Instructions:  The current medical regimen is effective;  continue present plan and medications.  Labwork: Please have a BMP today.  Follow-Up: Follow up in 4 months with Dr Anne Fu.  If you need a refill on your cardiac medications before your next appointment, please call your pharmacy.  Thank you for choosing [facility]!!

## 2015-04-25 NOTE — Progress Notes (Signed)
Cardiology Office Note   Date:  04/25/2015   ID:  Leslie Kline, DOB May 22, 1940, MRN [id number]  PCP:  No PCP Per Patient  Cardiologist:  Dr. Anne Fu   Chronic systolic CHF follow up    History of Present Illness: Leslie Kline is a 75 y.o. female with a history of HTN and recent admission 04/18/14 for new onset acute systolic CHF and previously uncontrolled hyperthyroidism who presents to clinic today for follow up.  She was admitted to [facility] from 04/13/14- 04/21/14 for newly diagnosed systolic CHF. Prior to this she had not seen a medical practitioner in over 6 years and was not taking any medications.  She finally decided to seek medical attention at [facility] on 04/13/2014. Chest x-ray demonstrated a moderate to severe cardiomegaly, moderate right and small left pleural effusion. EKG shows sinus tachycardia with ventricular rate of 120s with prolonged QTC.   2D ECHO on 04/14/2014 revealed EF 20-25%, mild to moderate MR, moderately reduced LVEF, moderate TR/PR, PA peak pressure 54. She was diuresed aggressively for acute systolic heart failure with biventricular dysfunction. Initial TSH was severely depressed. She was noted to have a right-sided goiter. Patient endorse a previous left-sided thyroidectomy in the past. Free T4 was obtained which was elevated. Internal medicine service was consulted for hyperthyroidism. She was placed on methimazole 10 mg 3 times a day. Her heart failure was felt to likely related to her hyperthyroidism resulting in high flow heart failure and eventually biventricular failure.   She underwent a heart catheterization on 04/18/2014 which showed wedge pressure 20 mmHg, cardiac index 1.91, cardiac output 3.5, normal coronaries. She was diuresed further and transition to oral 80mg  daily Lasix on 04/20/2014. Given her severely decreased EF and occasional nonsustained VT noted on telemetry, she was thought to be high risk for out of the hospital primary cardiac event and  was placed on LifeVest.  Radioactive iodine therapy was performed for hyperthyroidism. Neck mass noted.  She is feeling much better. No CP, SOB, orthopnea, PND, palpations, dizziness, syncope, tremors. She has chronic LE edema which is drastically improved from previous. She has been taking all of her medications as prescribed and tolerating them well. She has no complaints and is very pleased  to be feeling better.    On 07/11/14 and echocardiogram demonstrated ejection fraction mild improvement of 35-40%. She underwent radioactive iodine.   She is quite active. She has gained about 10 more pounds back. She's not feeling any significant shortness of breath other than normal baseline. She is working a few hours a day. Much improved.  No orthopnea. No CP, no orthopnea   Past Medical History  Diagnosis Date  . Hypertension   . Hypokalemia   . Nonischemic cardiomyopathy (HCC)     EF 20-25% Jan 2016, cath 04/18/2014 normal coronary. Likely result of uncontrolled hyperthyroidism  . Chronic systolic heart failure (HCC)   . Goiter     R sided, s/p L side thyroidectomy. R side goiter likely need biopsy in the future  . Nonsustained ventricular tachycardia (HCC)     no persistent v-tach, asymptomatic, likely related to LV dysfunction, placed on Lifevest  . Prolonged Q-T interval on ECG   . Anemia   . Anasarca     related to combination of heart failure and uncontrolled hyperthyroidism    Past Surgical History  Procedure Laterality Date  . Tubal ligation    . Goiter    . Tonsillectomy    . Abdominal hysterectomy    .  Left and right heart catheterization with coronary angiogram N/A 04/18/2014    Procedure: LEFT AND RIGHT HEART CATHETERIZATION WITH CORONARY ANGIOGRAM;  Surgeon: Peter M Swaziland, MD;  Location: [facility] CATH LAB;  Service: Cardiovascular;  Laterality: N/A;     Current Outpatient Prescriptions  Medication Sig Dispense Refill  . aspirin EC 81 MG EC tablet Take 1 tablet (81 mg total)  by mouth daily.    . carvedilol (COREG) 12.5 MG tablet TAKE 1 TABLET BY MOUTH TWICE DAILY WITH A MEAL 180 tablet 5  . furosemide (LASIX) 80 MG tablet TAKE 1 TABLET BY MOUTH EVERY DAY 30 tablet 3  . lisinopril (PRINIVIL,ZESTRIL) 10 MG tablet TAKE 1 TABLET(10 MG) BY MOUTH DAILY 30 tablet 10  . MAGNESIUM-OXIDE 400 (241.3 Mg) MG tablet TAKE 1 TABLET(400 MG) BY MOUTH DAILY 30 tablet 2  . methimazole (TAPAZOLE) 5 MG tablet Take 1 tablet (5 mg total) by mouth 2 (two) times daily. 180 tablet 0  . potassium chloride SA (K-DUR,KLOR-CON) 20 MEQ tablet TAKE 2 TABLETS BY MOUTH DAILY 180 tablet 3   No current facility-administered medications for this visit.    Allergies:   Review of patient's allergies indicates no known allergies.    Social History:  The patient  reports that she has never smoked. She has never used smokeless tobacco. She reports that she does not drink alcohol or use illicit drugs.   Family History:  The patient's family history includes Diabetes in her maternal grandfather and mother; Heart disease in her mother.    ROS:  Please see the history of present illness.  All other systems are reviewed and negative.    PHYSICAL EXAM: VS:  BP 128/72 mmHg  Pulse 64  Ht  (1.575 m)  Wt 208 lb 6.4 oz (94.53 kg)  BMI 38.11 kg/m2  SpO2 98% , BMI Body mass index is 38.11 kg/(m^2). GEN: Well nourished, well developed, in no acute distress HEENT: right neck mass Neck: no JVD, carotid bruits, right neck mass (hyperthyroid) Cardiac: RRR; no murmurs, rubs, or gallops, 1+ bilaterally pitting  LE edema  Respiratory:  clear to auscultation bilaterally, normal work of breathing GI: soft, nontender, nondistended, + BS MS: no deformity or atrophy Skin: warm and dry, no rash Neuro:  Strength and sensation are intact Psych: euthymic mood, full affect   EKG:   The ekg demonstrates NSR HR 98. RAD.   Recent Labs: 11/25/2014: BUN 22; Creatinine, Ser 0.60; Potassium 4.6; Sodium  143 02/10/2015: TSH 0.04*    Lipid Panel    Wt Readings from Last 3 Encounters:  04/25/15 208 lb 6.4 oz (94.53 kg)  02/10/15 199 lb (90.266 kg)  11/25/14 181 lb 12.8 oz (82.464 kg)      Other studies Reviewed: Additional studies/ records that were reviewed today include: 2D ECHO, RHC. Review of the above records demonstrates: 2D ECHO on 04/14/2014 revealed EF 20-25%, mild to moderate MR, moderately reduced LVEF, moderate TR/PR, PA peak pressure 54.  Cardiac catheterization 04/18/2014 Procedural Findings: Hemodynamics RA 16/21 mean 17 mm Hg RV 43/17 mm Hg PA 43/22 mean 31 mm Hg PCWP 23/23 mean 20 mm Hg LV 104/20 mm Hg AO 104/71 mean 84 mm Hg  Oxygen saturations: PA 52% AO 95%  Cardiac Output (Fick) 3.5 L/min  Cardiac Index (Fick) 1.91 L/min/meter squared.  Coronary angiography: Coronary dominance: codominant  Left mainstem: Normal  Left anterior descending (LAD): Normal  Left circumflex (LCx): Normal  Right coronary artery (RCA): Normal  Left ventriculography: Not done  Final Conclusions:  1. Normal coronary anatomy 2. Moderate pulmonary HTN with elevated left ventricular filling pressures.  Recommendations: Medical management of CHF.     ASSESSMENT AND PLAN:   Haunani Dickard is a 75 y.o. female with a history of HTN and recent admission 04/13/14 for new onset acute systolic CHF and uncontrolled hyperthyroidism who presents to clinic today for follow up.  Chronic systolic CHF-  NICM likely in part secondary to hyperthyroidism/thyrotoxicosis/ HTN-2D ECHO on 04/14/2014 revealed EF 20-25%, mild to moderate MR, moderately reduced LVEF, moderate TR/PR, PA peak pressure 54.   Echocardiogram 07/11/14 shows ejection fraction of 35-40%.   -- Continue Coreg 12.5mg  BID and Lisinopril from 10mg  qd. BP stable.   -- Continue Lasix 80mg  qd and Kdur . -- BMET previously reassuring. Recheck  Hyperthryoidism- sx improved. -- Seen by Dr. Elvera Lennox. 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  Right neck mass noted improved. Labs improved  HTN- BP well controlled today.  -- Continue coreg 12.5mg  BID, lisinopril increased to 10mg     Current medicines are reviewed at length with the patient today.  The patient does not have concerns regarding medicines.    Disposition:   I will see her back in 4 months.   Mathews Robinsons, MD  04/25/2015 9:05 AM    [facility] [address] [place], [place], [place]  [address] Phone: [phone]; Fax: [phone]

## 2015-05-15 ENCOUNTER — Other Ambulatory Visit: Payer: Self-pay | Admitting: Internal Medicine

## 2015-06-09 ENCOUNTER — Ambulatory Visit: Payer: Medicare HMO | Admitting: Internal Medicine

## 2015-06-12 ENCOUNTER — Other Ambulatory Visit: Payer: Self-pay | Admitting: Internal Medicine

## 2015-07-14 ENCOUNTER — Other Ambulatory Visit: Payer: Self-pay | Admitting: Internal Medicine

## 2015-07-14 NOTE — Telephone Encounter (Signed)
Ok to refill 

## 2015-07-14 NOTE — Telephone Encounter (Signed)
I'm not sure why she is taking this. I refilled it once, but I see that previous refills were per cardiology.Marland KitchenMarland Kitchen I will let them decide whether she still needs this. However, this is over-the-counter.

## 2015-07-19 ENCOUNTER — Telehealth: Payer: Self-pay | Admitting: Internal Medicine

## 2015-07-19 MED ORDER — MAGNESIUM OXIDE 400 (241.3 MG) MG PO TABS
ORAL_TABLET | ORAL | Status: DC
Start: 2015-07-19 — End: 2015-10-09

## 2015-07-19 NOTE — Telephone Encounter (Signed)
Pt needed refill. Refill of Magnesium Oxide sent to her pharmacy.

## 2015-07-19 NOTE — Telephone Encounter (Signed)
Patient need a Prior Auth of medication MAGNESIUM-OXIDE 400 (241.3 Mg) MG tablet.

## 2015-08-08 ENCOUNTER — Ambulatory Visit: Payer: Medicare Other | Admitting: Internal Medicine

## 2015-08-15 ENCOUNTER — Encounter: Payer: Self-pay | Admitting: Internal Medicine

## 2015-08-15 ENCOUNTER — Ambulatory Visit (INDEPENDENT_AMBULATORY_CARE_PROVIDER_SITE_OTHER): Payer: Medicare Other | Admitting: Internal Medicine

## 2015-08-15 VITALS — BP 126/78 | HR 70 | Temp 97.8°F | Resp 12 | Wt 218.0 lb

## 2015-08-15 DIAGNOSIS — E0500 Thyrotoxicosis with diffuse goiter without thyrotoxic crisis or storm: Secondary | ICD-10-CM | POA: Diagnosis not present

## 2015-08-15 DIAGNOSIS — E041 Nontoxic single thyroid nodule: Secondary | ICD-10-CM | POA: Diagnosis not present

## 2015-08-15 LAB — TSH: TSH: 0.6 u[IU]/mL (ref 0.35–4.50)

## 2015-08-15 LAB — T4, FREE: FREE T4: 0.82 ng/dL (ref 0.60–1.60)

## 2015-08-15 LAB — T3, FREE: T3 FREE: 3.4 pg/mL (ref 2.3–4.2)

## 2015-08-15 NOTE — Patient Instructions (Signed)
Please stop at the lab.  Continue Methimazole 5 mg 2x a day with meals.   Please return in 6 months.

## 2015-08-15 NOTE — Progress Notes (Signed)
Patient ID: Leslie Kline, female   DOB: [birth year], 75 y.o.   MRN: [id number]   HPI  Leslie Kline is a 75 y.o.-year-old female, initially referred by the Hospitalist team, Dr. 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 Last visit 7 mo ago. No PCP.  Reviewed hx: She noticed SOB for ~3 month. Went to the hospital 04/13/2014 for this >> found to be thyrotoxic and having CHF with an EF 20-25%.   Patient has a history of previous left hemithyroidectomy - 20-25 years ago - ? dx. She had a recent thyroid ultrasound (04/16/2014) showing a large right lobe heterogeneous nodule that replaces the entire R lobe. L lobe is absent. The size of the right lobe is [phone] cm. There are small areas of microcalcifications scattered throughout the nodule, which is predominantly solid. Blood flow was identified throughout areas of the nodule.  She was started on MMI 10 mg 3x a day - stopped 4 days prior to the RAI tx.  She had RAI tx (15.7 mCi) on 05/16/2014.  However, we restarted MMI 5 mg BID as TFTs were still abnormal. She continues this dose.   She feels her R thyroid nodule has decreased in size since the RAI tx.   I reviewed pt's thyroid tests:  Lab Results  Component Value Date   TSH 0.04* 02/10/2015   TSH 0.07* 11/22/2014   TSH 0.08* 10/10/2014   TSH 0.04* 09/02/2014   TSH 0.02* 07/14/2014   TSH 0.02* 06/09/2014   TSH 0.046* 04/13/2014   TSH 0.013* 04/13/2014   FREET4 0.98 02/10/2015   FREET4 1.22 11/22/2014   FREET4 4.68* 10/10/2014   FREET4 5.22* 09/02/2014   FREET4 3.29* 07/14/2014   FREET4 4.25* 06/09/2014   FREET4 5.36* 04/14/2014    Component     Latest Ref Rng 04/15/2014  Thyrotropin Receptor Ab     <=16.0 % 77.8 (H)   She is also on Coreg 12.5 mg 2x a day.  Pt denies feeling nodules in neck, hoarseness, dysphagia/odynophagia, SOB with lying down. She feels her goiter is decreasing in size.  She c/o: - + weight gain: previously weight loss: previously lost  ~100 lbs in 6 mo with her thyroid ds. - no fatigue - no excessive sweating/heat intolerance - + improved tremors - most in L hand - no anxiety - no palpitations - no hyperdefecation  She also has a history of congestive heart failure, stable.  When she started to have thyrotoxicosis, she weighed 280 lbs.  ROS: Constitutional: See HPI, + nocturia Eyes: no blurry vision, no xerophthalmia ENT: no sore throat, + nodule palpated in R throat, no dysphagia/odynophagia, no hoarseness Cardiovascular: no CP/SOB/no palpitations/no leg swelling Respiratory: no cough/SOB Gastrointestinal: no N/V/D/C Musculoskeletal: no muscle/joint aches Skin: no rashes Neurological: no tremors/numbness/tingling/dizziness  I reviewed pt's medications, allergies, PMH, social hx, family hx, and changes were documented in the history of present illness. Otherwise, unchanged from my initial visit note:  Past Medical History  Diagnosis Date  . Hypertension   . Hypokalemia   . Nonischemic cardiomyopathy (HCC)     EF 20-25% Jan 2016, cath 04/18/2014 normal coronary. Likely result of uncontrolled hyperthyroidism  . Chronic systolic heart failure (HCC)   . Goiter     R sided, s/p L side thyroidectomy. R side goiter likely need biopsy in the future  . Nonsustained ventricular tachycardia (HCC)     no persistent v-tach, asymptomatic, likely related to LV dysfunction, placed on Lifevest  . Prolonged Q-T interval on ECG   .  Anemia   . Anasarca     related to combination of heart failure and uncontrolled hyperthyroidism   Past Surgical History  Procedure Laterality Date  . Tubal ligation    . Goiter    . Tonsillectomy    . Abdominal hysterectomy    . Left and right heart catheterization with coronary angiogram N/A 04/18/2014    Procedure: LEFT AND RIGHT HEART CATHETERIZATION WITH CORONARY ANGIOGRAM;  Surgeon: Peter M Swaziland, MD;  Location: [facility] LAB;  Service: Cardiovascular;  Laterality: N/A;   History    Social History  . Marital Status: widowed    Spouse Name: N/A    Number of Children: 8   .   Social History Main Topics  . Smoking status: Never Smoker   . Smokeless tobacco: Never Used  . Alcohol Use: No  . Drug Use: No    Current Outpatient Prescriptions on File Prior to Visit  Medication Sig Dispense Refill  . aspirin EC 81 MG EC tablet Take 1 tablet (81 mg total) by mouth daily.    . carvedilol (COREG) 12.5 MG tablet TAKE 1 TABLET BY MOUTH TWICE DAILY WITH A MEAL 180 tablet 5  . furosemide (LASIX) 80 MG tablet TAKE 1 TABLET BY MOUTH EVERY DAY 30 tablet 3  . lisinopril (PRINIVIL,ZESTRIL) 10 MG tablet TAKE 1 TABLET(10 MG) BY MOUTH DAILY 30 tablet 10  . magnesium oxide (MAGNESIUM-OXIDE) 400 (241.3 Mg) MG tablet TAKE 1 TABLET(400 MG) BY MOUTH DAILY 30 tablet 2  . methimazole (TAPAZOLE) 5 MG tablet TAKE 1 TABLET(5 MG) BY MOUTH TWICE DAILY 180 tablet 0  . potassium chloride SA (K-DUR,KLOR-CON) 20 MEQ tablet TAKE 2 TABLETS BY MOUTH DAILY 180 tablet 3   No current facility-administered medications on file prior to visit.   No Known Allergies Family History  Problem Relation Age of Onset  . Diabetes Maternal Grandfather   . Heart disease Mother   . Diabetes Mother    PE: BP 126/78 mmHg  Pulse 70  Temp(Src) 97.8 F (36.6 C) (Oral)  Resp 12  Wt 218 lb (98.884 kg)  SpO2 97% Body mass index is 39.86 kg/(m^2). Wt Readings from Last 3 Encounters:  08/15/15 218 lb (98.884 kg)  04/25/15 208 lb 6.4 oz (94.53 kg)  02/10/15 199 lb (90.266 kg)   Constitutional: overweight, in NAD Eyes: PERRLA, EOMI, no lid lag, mild stare ENT: moist mucous membranes, + R thyromegaly, no cervical lymphadenopathy Cardiovascular: RRR, no MRG, no LE swelling Respiratory: CTA B Gastrointestinal: abdomen soft, NT, ND, BS+ Musculoskeletal: no deformities, strength intact in all 4 Skin: moist, warm, no rashes Neurological: + tremor with outstretched hands (L >R) and head, DTR normal in all  4  ASSESSMENT: 1. 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 Large R thyroid nodule  PLAN:  1. Patient with h/o thyrotoxicosis, previously with thyrotoxic sxs: drastic weight loss, heat intolerance, hyperdefecation, palpitations, anxiety. 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 Due to the severity of her ds (complicated by CHF), I suggested either RAI tx or surgery. She refused sx. >> had RAI tx 05/16/2014. Sxs are mostly resolved, except for persistent tremors. Her TFTs did not normalized after the surgery >> we had to start MMI 5 mg bid. She continues this dose for now. - will recheck TFTs today, I hope we can taper MMI to off at one point. We have  to absolutely avoid thyrotoxicosis 2/2 her CHF - will continue her beta blocker - this is per cardiology - RTC in 6 months  2. Right thyroid nodule - Patient had a thyroid ultrasound in 03/2014 showing an enlarged right thyroid lobe, which is virtually replaced by a right thyroid nodule. At that point, the patient was thyrotoxic.  - pt feels that her goiter is decreasing and she does not have neck compression sxs - we may need to repeat a thyroid U/S in the future, after she becomes euthyroid  Daughter's phone nr: [phone] - to call with results  Component     Latest Ref Rng 08/15/2015  TSH     0.35 - 4.50 uIU/mL 0.60  T4,Free(Direct)     0.60 - 1.60 ng/dL 0.98  Triiodothyronine,Free,Serum     2.3 - 4.2 pg/mL 3.4  TFTs normal. Since TSH is on the lower side of normal, will continue with her current dose of methimazole and recheck her thyroid tests in 2 months.

## 2015-08-16 ENCOUNTER — Encounter: Payer: Self-pay | Admitting: *Deleted

## 2015-08-22 ENCOUNTER — Encounter: Payer: Medicare Other | Admitting: Cardiology

## 2015-08-22 NOTE — Progress Notes (Signed)
This encounter was created in error - please disregard.

## 2015-09-18 ENCOUNTER — Encounter: Payer: Self-pay | Admitting: Cardiology

## 2015-10-09 ENCOUNTER — Other Ambulatory Visit: Payer: Self-pay | Admitting: Internal Medicine

## 2015-10-16 ENCOUNTER — Other Ambulatory Visit: Payer: Self-pay | Admitting: Cardiology

## 2015-10-17 ENCOUNTER — Other Ambulatory Visit: Payer: Self-pay

## 2015-10-17 MED ORDER — CARVEDILOL 12.5 MG PO TABS: ORAL_TABLET | ORAL | 0 refills | Status: DC

## 2015-11-01 ENCOUNTER — Encounter: Payer: Self-pay | Admitting: Cardiology

## 2015-11-16 ENCOUNTER — Ambulatory Visit (INDEPENDENT_AMBULATORY_CARE_PROVIDER_SITE_OTHER): Payer: Medicare Other | Admitting: Cardiology

## 2015-11-16 ENCOUNTER — Encounter: Payer: Self-pay | Admitting: Cardiology

## 2015-11-16 VITALS — BP 130/86 | HR 59 | Ht 64.0 in

## 2015-11-16 DIAGNOSIS — I5022 Chronic systolic (congestive) heart failure: Secondary | ICD-10-CM

## 2015-11-16 DIAGNOSIS — I10 Essential (primary) hypertension: Secondary | ICD-10-CM | POA: Diagnosis not present

## 2015-11-16 DIAGNOSIS — I428 Other cardiomyopathies: Secondary | ICD-10-CM

## 2015-11-16 DIAGNOSIS — I429 Cardiomyopathy, unspecified: Secondary | ICD-10-CM | POA: Diagnosis not present

## 2015-11-16 DIAGNOSIS — I272 Other secondary pulmonary hypertension: Secondary | ICD-10-CM

## 2015-11-16 DIAGNOSIS — Z79899 Other long term (current) drug therapy: Secondary | ICD-10-CM | POA: Diagnosis not present

## 2015-11-16 DIAGNOSIS — E049 Nontoxic goiter, unspecified: Secondary | ICD-10-CM

## 2015-11-16 LAB — BASIC METABOLIC PANEL
BUN: 28 mg/dL — ABNORMAL HIGH (ref 7–25)
CALCIUM: 8.8 mg/dL (ref 8.6–10.4)
CO2: 23 mmol/L (ref 20–31)
Chloride: 106 mmol/L (ref 98–110)
Creat: 1.09 mg/dL — ABNORMAL HIGH (ref 0.60–0.93)
GLUCOSE: 99 mg/dL (ref 65–99)
POTASSIUM: 4.1 mmol/L (ref 3.5–5.3)
SODIUM: 141 mmol/L (ref 135–146)

## 2015-11-16 MED ORDER — METHIMAZOLE 5 MG PO TABS: 5.0000 mg | ORAL_TABLET | Freq: Every day | ORAL | 11 refills | Status: DC

## 2015-11-16 NOTE — Progress Notes (Signed)
Cardiology Office Note   Date:  11/16/2015   ID:  Leslie CurtisMary Kline, DOB 04/17/1940, MRN [id number]  PCP:  No PCP Per Patient  Cardiologist:  Dr. Anne FuSkains   Chronic systolic CHF follow up    History of Present Illness: Leslie CurtisMary Kline is a 75 y.o. female with a history of HTN and recent admission 04/18/14 for new onset acute systolic CHF and previously uncontrolled hyperthyroidism who presents to clinic today for follow up.  [facility] was [facility]/13[facility]- 04/21/14 for newly diagnosed systolic CHF. Prior to this she had not seen a medical practitioner in over 6 years and was not taking any medications.  She finally decided to seek medical [facility] on 04/13/2014. Chest x-ray demonstrated a moderate to severe cardiomegaly, moderate right and small left pleural effusion. EKG shows sinus tachycardia with ventricular rate of 120s with prolonged QTC.   2D ECHO on 04/14/2014 revealed EF 20-25%, mild to moderate MR, moderately reduced LVEF, moderate TR/PR, PA peak pressure 54. She was diuresed aggressively for acute systolic heart failure with biventricular dysfunction. Initial TSH was severely depressed. She was noted to have a right-sided goiter. Patient endorse a previous left-sided thyroidectomy in the past. Free T4 was obtained which was elevated. Internal medicine service was consulted for hyperthyroidism. She was placed on methimazole 10 mg 3 times a day. Her heart failure was felt to likely related to her hyperthyroidism resulting in high flow heart failure and eventually biventricular failure.   She underwent a heart catheterization on 04/18/2014 which showed wedge pressure 20 mmHg, cardiac index 1.91, cardiac output 3.5, normal coronaries. She was diuresed further and transition to oral 80mg  daily Lasix on 04/20/2014. Given her severely decreased EF and occasional nonsustained VT noted on telemetry, she was thought to be high risk for out of the hospital primary cardiac event and  was placed on LifeVest.  Radioactive iodine therapy was performed for hyperthyroidism. Neck mass noted.  She is feeling much better. No CP, SOB, orthopnea, PND, palpations, dizziness, syncope, tremors. She has chronic LE edema which is drastically improved from previous. She has been taking all of her medications as prescribed and tolerating them well. She has no complaints and is very pleased  to be feeling better.    On 07/11/14 and echocardiogram demonstrated ejection fraction mild improvement of 35-40%.   She is quite active. She has gained about 10-20 more pounds back. She's not feeling any significant shortness of breath other than normal baseline. She is working a few hours a day. Much improved.  No orthopnea. No CP, no orthopnea. Stable   Past Medical History:  Diagnosis Date  . Anasarca    related to combination of heart failure and uncontrolled hyperthyroidism  . Anemia   . Chronic systolic heart failure (HCC)   . Goiter    R sided, s/p L side thyroidectomy. R side goiter likely need biopsy in the future  . Hypertension   . Hypokalemia   . Nonischemic cardiomyopathy (HCC)    EF 20-25% Jan 2016, cath 04/18/2014 normal coronary. Likely result of uncontrolled hyperthyroidism  . Nonsustained ventricular tachycardia (HCC)    no persistent v-tach, asymptomatic, likely related to LV dysfunction, placed on Lifevest  . Prolonged Q-T interval on ECG     Past Surgical History:  Procedure Laterality Date  . ABDOMINAL HYSTERECTOMY    . goiter    . LEFT AND RIGHT HEART CATHETERIZATION WITH CORONARY ANGIOGRAM N/A 04/18/2014   Procedure: LEFT AND RIGHT  HEART CATHETERIZATION WITH CORONARY ANGIOGRAM;  Surgeon: Peter M Swaziland, MD;  Location: [facility] CATH LAB;  Service: Cardiovascular;  Laterality: N/A;  . TONSILLECTOMY    . TUBAL LIGATION       Current Outpatient Prescriptions  Medication Sig Dispense Refill  . aspirin EC 81 MG EC tablet Take 1 tablet (81 mg total) by mouth daily.    .  carvedilol (COREG) 12.5 MG tablet TAKE 1 TABLET BY MOUTH TWICE DAILY WITH A MEAL 180 tablet 0  . furosemide (LASIX) 80 MG tablet TAKE 1 TABLET BY MOUTH EVERY DAY 30 tablet 3  . lisinopril (PRINIVIL,ZESTRIL) 10 MG tablet TAKE 1 TABLET(10 MG) BY MOUTH DAILY 30 tablet 10  . MAGNESIUM-OXIDE 400 (241.3 Mg) MG tablet TAKE 1 TABLET(400 MG) BY MOUTH DAILY 30 tablet 3  . methimazole (TAPAZOLE) 5 MG tablet Take 1 tablet (5 mg total) by mouth daily. 30 tablet 11  . potassium chloride SA (K-DUR,KLOR-CON) 20 MEQ tablet TAKE 2 TABLETS BY MOUTH DAILY 180 tablet 3   No current facility-administered medications for this visit.     Allergies:   Review of patient's allergies indicates no known allergies.    Social History:  The patient  reports that she has never smoked. She has never used smokeless tobacco. She reports that she does not drink alcohol or use drugs.   Family History:  The patient's family history includes Diabetes in her maternal grandfather and mother; Heart disease in her mother.    ROS:  Please see the history of present illness.  All other systems are reviewed and negative.    PHYSICAL EXAM: VS:  BP 130/86   Pulse (!) 59   Ht 5\' 4"  (1.626 m)  , BMI There is no height or weight on file to calculate BMI. GEN: Well nourished, well developed, in no acute distress  HEENT: right neck mass Neck: no JVD, carotid bruits, right neck mass (hyperthyroid) Cardiac: RRR; no murmurs, rubs, or gallops, 1+ bilaterally pitting  LE edema  Respiratory:  clear to auscultation bilaterally, normal work of breathing GI: soft, nontender, nondistended, + BS MS: no deformity or atrophy  Skin: warm and dry, no rash Neuro:  Strength and sensation are intact Psych: euthymic mood, full affect   EKG:   The ekg today 11/16/15 shows sinus bradycardia with sinus arrhythmia and nonspecific ST-T wave changes heart rate 59 bpm personally viewed-no significant change from prior-other than decreased heart rate  demonstrates NSR HR 98. RAD.   Recent Labs: 04/25/2015: BUN 18; Creat 0.73; Potassium 3.9; Sodium 139 08/15/2015: TSH 0.60    Lipid Panel    Wt Readings from Last 3 Encounters:  08/15/15 218 lb (98.9 kg)  04/25/15 208 lb 6.4 oz (94.5 kg)  02/10/15 199 lb (90.3 kg)      Other studies Reviewed: Additional studies/ records that were reviewed today include: 2D ECHO, RHC. Review of the above records demonstrates: 2D ECHO on 04/14/2014 revealed EF 20-25%, mild to moderate MR, moderately reduced LVEF, moderate TR/PR, PA peak pressure 54.  Cardiac catheterization 04/18/2014 Procedural Findings: Hemodynamics RA 16/21 mean 17 mm Hg RV 43/17 mm Hg PA 43/22 mean 31 mm Hg PCWP 23/23 mean 20 mm Hg LV 104/20 mm Hg AO 104/71 mean 84 mm Hg  Oxygen saturations: PA 52% AO 95%  Cardiac Output (Fick) 3.5 L/min  Cardiac Index (Fick) 1.91 L/min/meter squared.  Coronary angiography: Coronary dominance: codominant  Left mainstem: Normal  Left anterior descending (LAD): Normal  Left circumflex (LCx): Normal  Right coronary artery (RCA): Normal  Left ventriculography: Not done  Final Conclusions:  1. Normal coronary anatomy 2. Moderate pulmonary HTN with elevated left ventricular filling pressures.  Recommendations: Medical management of CHF.     ASSESSMENT AND PLAN:   Leslie Kline is a 75 y.o. female with a history of HTN and admission 04/13/14 for new onset acute systolic CHF and uncontrolled hyperthyroidism who presents to clinic today for follow up.  Chronic systolic CHF-  NICM likely in part secondary to hyperthyroidism/thyrotoxicosis/ HTN-2D ECHO on 04/14/2014 revealed EF 20-25%, mild to moderate MR, moderately reduced LVEF, moderate TR/PR, PA peak pressure 54.   Echocardiogram 07/11/14 shows ejection fraction of 35-40%.   -- Continue Coreg 12.5mg  BID and Lisinopril from 10mg  qd. BP stable.   -- Continue Lasix 80mg  qd and Kdur . -- BMET previously reassuring.  Recheck  Hyperthryoidism- sx improved. -- Seen by Dr. Elvera Lennox. 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  Right neck mass noted improved. Labs improved.  HTN- BP well controlled today.  -- Continue coreg 12.5mg  BID, lisinopril 10mg    No changes made. Checking lab work.  Current medicines are reviewed at length with the patient today.  The patient does not have concerns regarding medicines.    Disposition:   See her back in 6 months, Nada Boozer, NP. Me in 12 months   Signed, Donato Schultz, MD  11/16/2015 1:43 PM    [facility] [address] [place], [place], [place]  [address] Phone: [phone]; Fax: [phone]

## 2015-11-16 NOTE — Patient Instructions (Signed)
Medication Instructions:  The current medical regimen is effective;  continue present plan and medications.  Labwork: Please have blood work today (BMP)  Follow-Up: Follow up in 6 months with Laura Ingold, NP.  You will receive a letter in the mail 2 months before you are due.  Please call us when you receive this letter to schedule your follow up appointment.  Follow up in 1 year with Dr. Skains.  You will receive a letter in the mail 2 months before you are due.  Please call us when you receive this letter to schedule your follow up appointment.  If you need a refill on your cardiac medications before your next appointment, please call your pharmacy.  Thank you for choosing [facility]!!     

## 2015-11-28 ENCOUNTER — Other Ambulatory Visit: Payer: Self-pay | Admitting: Cardiology

## 2015-12-13 ENCOUNTER — Other Ambulatory Visit: Payer: Self-pay | Admitting: Cardiology

## 2015-12-18 ENCOUNTER — Other Ambulatory Visit: Payer: Self-pay | Admitting: Cardiology

## 2016-01-15 ENCOUNTER — Other Ambulatory Visit: Payer: Self-pay | Admitting: Cardiology

## 2016-01-16 ENCOUNTER — Other Ambulatory Visit: Payer: Self-pay

## 2016-01-16 MED ORDER — CARVEDILOL 12.5 MG PO TABS: ORAL_TABLET | ORAL | 3 refills | Status: DC

## 2016-02-01 ENCOUNTER — Other Ambulatory Visit: Payer: Self-pay | Admitting: Internal Medicine

## 2016-02-01 NOTE — Telephone Encounter (Signed)
Please advise if ok to refill. Last office visit was 02/10/2015. Thanks!

## 2016-02-07 ENCOUNTER — Other Ambulatory Visit: Payer: Self-pay | Admitting: Internal Medicine

## 2016-02-19 ENCOUNTER — Ambulatory Visit (INDEPENDENT_AMBULATORY_CARE_PROVIDER_SITE_OTHER): Payer: Medicare Other | Admitting: Internal Medicine

## 2016-02-19 ENCOUNTER — Encounter: Payer: Self-pay | Admitting: Internal Medicine

## 2016-02-19 VITALS — BP 138/90 | HR 86 | Wt 227.4 lb

## 2016-02-19 DIAGNOSIS — E0500 Thyrotoxicosis with diffuse goiter without thyrotoxic crisis or storm: Secondary | ICD-10-CM | POA: Diagnosis not present

## 2016-02-19 DIAGNOSIS — E041 Nontoxic single thyroid nodule: Secondary | ICD-10-CM

## 2016-02-19 LAB — TSH: TSH: 0.07 u[IU]/mL — ABNORMAL LOW (ref 0.35–4.50)

## 2016-02-19 LAB — T4, FREE: FREE T4: 1.37 ng/dL (ref 0.60–1.60)

## 2016-02-19 LAB — T3, FREE: T3, Free: 3.7 pg/mL (ref 2.3–4.2)

## 2016-02-19 NOTE — Patient Instructions (Addendum)
Please continue Methimazole 5 mg 1x a day for now.  Please stop at the lab.

## 2016-02-19 NOTE — Progress Notes (Signed)
Patient ID: Maiah Bratz, female   DOB: 1940/06/17, 75 y.o.   MRN: [id number]   HPI  Shacoya Cregg is a 75 y.o.-year-old female, initially referred by the Hospitalist team, Dr. 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 Last visit 6 mo ago. No PCP.  Reviewed hx: She noticed SOB for ~3 month. Went to the hospital 04/13/2014 for this >> found to be thyrotoxic and having CHF with an EF 20-25%.   Patient has a history of previous left hemithyroidectomy - 20-25 years ago - ? dx. She had a recent thyroid ultrasound (04/16/2014) showing a large right lobe heterogeneous nodule that replaces the entire R lobe. L lobe is absent. The size of the right lobe is [phone] cm. There are small areas of microcalcifications scattered throughout the nodule, which is predominantly solid. Blood flow was identified throughout areas of the nodule.  She was started on MMI 10 mg 3x a day - stopped 4 days prior to the RAI tx.  She had RAI tx (15.7 mCi) on 05/16/2014.  However, we restarted MMI 5 mg BID as TFTs were still abnormal. Last set of TFTs normal 6 mo ago. She is now on MMI 5 mg 1x a day. She did not return for labs in 2 months after last set of labs.  She feels her R thyroid nodule has decreased in size since the RAI tx. No hoarseness, dysphagia, neck pressure.  I reviewed pt's thyroid tests:  Lab Results  Component Value Date   TSH 0.60 08/15/2015   TSH 0.04 (L) 02/10/2015   TSH 0.07 (L) 11/22/2014   TSH 0.08 (L) 10/10/2014   TSH 0.04 (L) 09/02/2014   TSH 0.02 (L) 07/14/2014   TSH 0.02 (L) 06/09/2014   TSH 0.046 (L) 04/13/2014   TSH 0.013 (L) 04/13/2014   FREET4 0.82 08/15/2015   FREET4 0.98 02/10/2015   FREET4 1.22 11/22/2014   FREET4 4.68 (H) 10/10/2014   FREET4 5.22 (H) 09/02/2014   FREET4 3.29 (H) 07/14/2014   FREET4 4.25 (H) 06/09/2014   FREET4 5.36 (H) 04/14/2014    Component     Latest Ref Rng 04/15/2014  Thyrotropin Receptor Ab     <=16.0 % 77.8 (H)   She is also on  Coreg 12.5 mg 2x a day.  Pt denies feeling nodules in neck, hoarseness, dysphagia/odynophagia, SOB with lying down. She feels her goiter is decreasing in size.  She c/o: - + weight gain: previously lost ~100 lbs in 6 mo with her thyroid ds. - no fatigue - no excessive sweating/heat intolerance - + tremors - no anxiety - no palpitations - no hyperdefecation  She also has a history of congestive heart failure, stable.  When she started to have thyrotoxicosis, she weighed 280 lbs.  ROS: Constitutional: See HPI, + nocturia Eyes: no blurry vision, no xerophthalmia ENT: no sore throat, + nodule palpated in R throat, no dysphagia/odynophagia, no hoarseness Cardiovascular: no CP/SOB/no palpitations/no leg swelling Respiratory: no cough/SOB Gastrointestinal: no N/V/D/C Musculoskeletal: no muscle/joint aches Skin: no rashes Neurological: no tremors/numbness/tingling/dizziness  I reviewed pt's medications, allergies, PMH, social hx, family hx, and changes were documented in the history of present illness. Otherwise, unchanged from my initial visit note:  Past Medical History:  Diagnosis Date  . Anasarca    related to combination of heart failure and uncontrolled hyperthyroidism  . Anemia   . Chronic systolic heart failure (HCC)   . Goiter    R sided, s/p L side thyroidectomy. R side goiter likely need  biopsy in the future  . Hypertension   . Hypokalemia   . Nonischemic cardiomyopathy (HCC)    EF 20-25% Jan 2016, cath 04/18/2014 normal coronary. Likely result of uncontrolled hyperthyroidism  . Nonsustained ventricular tachycardia (HCC)    no persistent v-tach, asymptomatic, likely related to LV dysfunction, placed on Lifevest  . Prolonged Q-T interval on ECG    Past Surgical History:  Procedure Laterality Date  . ABDOMINAL HYSTERECTOMY    . goiter    . LEFT AND RIGHT HEART CATHETERIZATION WITH CORONARY ANGIOGRAM N/A 04/18/2014   Procedure: LEFT AND RIGHT HEART CATHETERIZATION  WITH CORONARY ANGIOGRAM;  Surgeon: Peter M Swaziland, MD;  Location: [facility] CATH LAB;  Service: Cardiovascular;  Laterality: N/A;  . TONSILLECTOMY    . TUBAL LIGATION     History   Social History  . Marital Status: widowed    Spouse Name: N/A    Number of Children: 8   .   Social History Main Topics  . Smoking status: Never Smoker   . Smokeless tobacco: Never Used  . Alcohol Use: No  . Drug Use: No    Current Outpatient Prescriptions on File Prior to Visit  Medication Sig Dispense Refill  . aspirin EC 81 MG EC tablet Take 1 tablet (81 mg total) by mouth daily.    . carvedilol (COREG) 12.5 MG tablet TAKE 1 TABLET BY MOUTH TWICE DAILY WITH A MEAL 180 tablet 0  . carvedilol (COREG) 12.5 MG tablet TAKE 1 TABLET BY MOUTH TWICE DAILY WITH A MEAL 180 tablet 3  . furosemide (LASIX) 80 MG tablet TAKE 1 TABLET BY MOUTH EVERY DAY 30 tablet 3  . furosemide (LASIX) 80 MG tablet TAKE 1 TABLET BY MOUTH EVERY DAY 30 tablet 10  . lisinopril (PRINIVIL,ZESTRIL) 10 MG tablet TAKE 1 TABLET(10 MG) BY MOUTH DAILY 30 tablet 11  . MAGNESIUM-OXIDE 400 (241.3 Mg) MG tablet TAKE 1 TABLET(400 MG) BY MOUTH DAILY 30 tablet 0  . methimazole (TAPAZOLE) 5 MG tablet Take 1 tablet (5 mg total) by mouth daily. 30 tablet 11  . potassium chloride SA (K-DUR,KLOR-CON) 20 MEQ tablet TAKE 2 TABLETS BY MOUTH DAILY 180 tablet 3   No current facility-administered medications on file prior to visit.    No Known Allergies Family History  Problem Relation Age of Onset  . Heart disease Mother   . Diabetes Mother   . Diabetes Maternal Grandfather    PE: BP 138/90   Pulse 86   Wt 227 lb 6.4 oz (103.1 kg)   SpO2 97%   BMI 39.03 kg/m  Body mass index is 39.03 kg/m. Wt Readings from Last 3 Encounters:  02/19/16 227 lb 6.4 oz (103.1 kg)  08/15/15 218 lb (98.9 kg)  04/25/15 208 lb 6.4 oz (94.5 kg)   Constitutional: obese, in NAD Eyes: PERRLA, EOMI, no lid lag, mild stare ENT: moist mucous membranes, + signif. R thyromegaly,  no cervical lymphadenopathy Cardiovascular: RRR, no MRG, no LE swelling Respiratory: CTA B Gastrointestinal: abdomen soft, NT, ND, BS+ Musculoskeletal: no deformities, strength intact in all 4 Skin: moist, warm, no rashes Neurological: + tremor with outstretched hands (L >R) and head, DTR normal in all 4  ASSESSMENT: 1. 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 Large R thyroid nodule  PLAN:  1. Patient with h/o thyrotoxicosis, previously with thyrotoxic sxs: drastic weight loss, heat intolerance, hyperdefecation, palpitations, anxiety. 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 Due to the severity of her ds (complicated by CHF), I suggested either RAI tx or surgery. She had RAI tx 05/16/2014. Sxs are mostly resolved, except for persistent tremors. Her TFTs did not normalized after the surgery >> we had to start MMI 5 mg bid. She dropped the dose to 1x a day since last visit, but did not return for repeat labs in 2 mo. - will recheck TFTs today, I hope we can taper MMI to off. We have to absolutely avoid thyrotoxicosis 2/2 her CHF - will continue her beta blocker - this is per cardiology - RTC in 6 months  2. Right thyroid nodule - Patient had a thyroid ultrasound in 03/2014 showing an enlarged right thyroid lobe, which is virtually replaced by a right thyroid nodule. At that point, the patient was thyrotoxic.  - she does not have neck compression sxs - we may need to repeat a thyroid U/S 1 year after she becomes euthyroid  Daughter's phone nr: [phone] - to call with results. Also, pt prefers a letter.  Component     Latest Ref Rng & Units 02/19/2016          TSH     0.35 - 4.50 uIU/mL 0.07 (L)  T4,Free(Direct)     0.60 - 1.60 ng/dL 0.981.37  Triiodothyronine,Free,Serum     2.3 - 4.2 pg/mL 3.7   TSH is still suppressed on the once a day 5 mg methimazole. At this point, I  suggest retreating with radioactive iodine. However, I would first obtain a thyroid uptake and scan to check on her Graves activity but also to investigate the right thyroid nodule. If this appears cold, will go ahead and biopsy it before RAI treatment. Unfortunately, communication with the patient is hindered by the fact that we need to either send her a letter (the last one she didn't get) or to go through her daughter.  Carlus Pavlovristina Rakeya Glab, MD PhD [facility]

## 2016-02-20 ENCOUNTER — Encounter: Payer: Self-pay | Admitting: Internal Medicine

## 2016-03-01 ENCOUNTER — Telehealth: Payer: Self-pay | Admitting: Internal Medicine

## 2016-03-01 NOTE — Telephone Encounter (Signed)
Patient ask you to call her concerning the two referral appointment, she stated she was not aware, and woild like to know what they are about. Please advise

## 2016-03-04 NOTE — Telephone Encounter (Signed)
Returned call. No answer. Will try later. 

## 2016-03-04 NOTE — Telephone Encounter (Signed)
Spoke to patient. Patient had questions about referral. Went over referral details and appt details. Patient verbalized understanding.

## 2016-03-06 ENCOUNTER — Encounter (HOSPITAL_COMMUNITY): Admission: RE | Admit: 2016-03-06 | Payer: Medicare Other | Source: Ambulatory Visit

## 2016-03-06 NOTE — Telephone Encounter (Signed)
Noted  

## 2016-03-06 NOTE — Telephone Encounter (Signed)
Radiology office called stated patient came to her appointment Thyroid uptake and scan. She refused the test.  [facility]

## 2016-03-07 ENCOUNTER — Encounter (HOSPITAL_COMMUNITY): Payer: Medicare Other

## 2016-03-07 NOTE — Telephone Encounter (Signed)
Let's schedule her for another appointment in a month and a half. We'll discuss then.

## 2016-03-11 ENCOUNTER — Other Ambulatory Visit: Payer: Self-pay | Admitting: Internal Medicine

## 2016-03-12 NOTE — Telephone Encounter (Signed)
Called patient.  No answer.

## 2016-03-13 NOTE — Telephone Encounter (Signed)
[facility] calling for refills on mag oxide 400 mg

## 2016-08-20 ENCOUNTER — Ambulatory Visit: Payer: Medicare Other | Admitting: Internal Medicine

## 2016-08-20 DIAGNOSIS — Z0289 Encounter for other administrative examinations: Secondary | ICD-10-CM

## 2016-08-20 NOTE — Progress Notes (Deleted)
Patient ID: Larua Collier, female   DOB: 1940-09-07, 76 y.o.   MRN: [id number]   HPI  Tahlor Berenguer is a 76 y.o.-year-old female, initially referred by the Hospitalist team, Dr. 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 Last visit 6 mo ago. No PCP.  Reviewed hx: She noticed SOB for ~3 month. Went to the hospital 04/13/2014 for this >> found to be thyrotoxic and having CHF with an EF 20-25%.   Patient has a history of previous left hemithyroidectomy - 20-25 years ago - ? dx. She had a recent thyroid ultrasound (04/16/2014) showing a large right lobe heterogeneous nodule that replaces the entire R lobe. L lobe is absent. The size of the right lobe is [phone] cm. There are small areas of microcalcifications scattered throughout the nodule, which is predominantly solid. Blood flow was identified throughout areas of the nodule.  She was started on MMI 10 mg 3x a day - stopped 4 days prior to the RAI tx.  She had RAI tx (15.7 mCi) on 05/16/2014.  However, we restarted MMI 5 mg BID as TFTs were still abnormal. Currently on MMI 5 mg daily.  At last OV, as TFTs were still thyrotoxic, I suggested another RAI tx. She did not have this yet. Unfortunately, communication with the patient is hindered by the fact that we need to either send her a letter (the last one she didn't get) or to go through her daughter.  I reviewed pt's thyroid tests:  Lab Results  Component Value Date   TSH 0.07 (L) 02/19/2016   TSH 0.60 08/15/2015   TSH 0.04 (L) 02/10/2015   TSH 0.07 (L) 11/22/2014   TSH 0.08 (L) 10/10/2014   TSH 0.04 (L) 09/02/2014   TSH 0.02 (L) 07/14/2014   TSH 0.02 (L) 06/09/2014   TSH 0.046 (L) 04/13/2014   TSH 0.013 (L) 04/13/2014   FREET4 1.37 02/19/2016   FREET4 0.82 08/15/2015   FREET4 0.98 02/10/2015   FREET4 1.22 11/22/2014   FREET4 4.68 (H) 10/10/2014   FREET4 5.22 (H) 09/02/2014   FREET4 3.29 (H) 07/14/2014   FREET4 4.25 (H) 06/09/2014   FREET4 5.36 (H) 04/14/2014     Component     Latest Ref Rng 04/15/2014  Thyrotropin Receptor Ab     <=16.0 % 77.8 (H)   She is also on Coreg 12.5 mg 2x a day.  Pt denies: - feeling nodules in neck - hoarseness - dysphagia - choking - SOB with lying down Feels her goiter has decreased in size.  She c/o: - + weight gain: she described a  ~100 lbs weight loss in 6 mo 2/2 thyroid ds. Denies: - heat intolerance - tremors - palpitations - anxiety - hyperdefecation - hair loss  She also has a history of CHF, stable.  When she started to have thyrotoxicosis, she weighed 280 lbs.  ROS: Constitutional: no weight gain/no weight loss, no fatigue, no subjective hyperthermia, no subjective hypothermia Eyes: no blurry vision, no xerophthalmia ENT: no sore throat, no nodules palpated in throat, no dysphagia, no odynophagia, no hoarseness Cardiovascular: no CP/no SOB/no palpitations/no leg swelling Respiratory: no cough/no SOB/no wheezing Gastrointestinal: no N/no V/no D/no C/no acid reflux Musculoskeletal: no muscle aches/no joint aches Skin: no rashes, no hair loss Neurological: no tremors/no numbness/no tingling/no dizziness  I reviewed pt's medications, allergies, PMH, social hx, family hx, and changes were documented in the history of present illness. Otherwise, unchanged from my initial visit note.   Past Medical History:  Diagnosis  Date  . Anasarca    related to combination of heart failure and uncontrolled hyperthyroidism  . Anemia   . Chronic systolic heart failure (HCC)   . Goiter    R sided, s/p L side thyroidectomy. R side goiter likely need biopsy in the future  . Hypertension   . Hypokalemia   . Nonischemic cardiomyopathy (HCC)    EF 20-25% Jan 2016, cath 04/18/2014 normal coronary. Likely result of uncontrolled hyperthyroidism  . Nonsustained ventricular tachycardia (HCC)    no persistent v-tach, asymptomatic, likely related to LV dysfunction, placed on Lifevest  . Prolonged Q-T interval on  ECG    Past Surgical History:  Procedure Laterality Date  . ABDOMINAL HYSTERECTOMY    . goiter    . LEFT AND RIGHT HEART CATHETERIZATION WITH CORONARY ANGIOGRAM N/A 04/18/2014   Procedure: LEFT AND RIGHT HEART CATHETERIZATION WITH CORONARY ANGIOGRAM;  Surgeon: Peter M Swaziland, MD;  Location: [facility] CATH LAB;  Service: Cardiovascular;  Laterality: N/A;  . TONSILLECTOMY    . TUBAL LIGATION     History   Social History  . Marital Status: widowed    Spouse Name: N/A    Number of Children: 8   .   Social History Main Topics  . Smoking status: Never Smoker   . Smokeless tobacco: Never Used  . Alcohol Use: No  . Drug Use: No    Current Outpatient Prescriptions on File Prior to Visit  Medication Sig Dispense Refill  . aspirin EC 81 MG EC tablet Take 1 tablet (81 mg total) by mouth daily.    . carvedilol (COREG) 12.5 MG tablet TAKE 1 TABLET BY MOUTH TWICE DAILY WITH A MEAL 180 tablet 0  . carvedilol (COREG) 12.5 MG tablet TAKE 1 TABLET BY MOUTH TWICE DAILY WITH A MEAL 180 tablet 3  . furosemide (LASIX) 80 MG tablet TAKE 1 TABLET BY MOUTH EVERY DAY 30 tablet 3  . furosemide (LASIX) 80 MG tablet TAKE 1 TABLET BY MOUTH EVERY DAY 30 tablet 10  . lisinopril (PRINIVIL,ZESTRIL) 10 MG tablet TAKE 1 TABLET(10 MG) BY MOUTH DAILY 30 tablet 11  . MAGNESIUM-OXIDE 400 (241.3 Mg) MG tablet TAKE 1 TABLET(400 MG) BY MOUTH DAILY 30 tablet 0  . methimazole (TAPAZOLE) 5 MG tablet Take 1 tablet (5 mg total) by mouth daily. 30 tablet 11  . potassium chloride SA (K-DUR,KLOR-CON) 20 MEQ tablet TAKE 2 TABLETS BY MOUTH DAILY 180 tablet 3   No current facility-administered medications on file prior to visit.    No Known Allergies Family History  Problem Relation Age of Onset  . Heart disease Mother   . Diabetes Mother   . Diabetes Maternal Grandfather    PE: There were no vitals taken for this visit. There is no height or weight on file to calculate BMI. Wt Readings from Last 3 Encounters:  02/19/16 227 lb  6.4 oz (103.1 kg)  08/15/15 218 lb (98.9 kg)  04/25/15 208 lb 6.4 oz (94.5 kg)   Constitutional: overweight, in NAD Eyes: PERRLA, EOMI, no exophthalmos ENT: moist mucous membranes, + signif. R thyromegaly, no cervical lymphadenopathy Cardiovascular: RRR, No MRG Respiratory: CTA B Gastrointestinal: abdomen soft, NT, ND, BS+ Musculoskeletal: no deformities, strength intact in all 4 Skin: moist, warm, no rashes Neurological: no tremor with outstretched hands, DTR normal in all 4  ASSESSMENT: 1. 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 Large R thyroid nodule  PLAN:  1. Patient with h/o thyrotoxicosis, previously with thyrotoxic sxs: drastic weight loss,  heat intolerance, hyperdefecation, palpitations, anxiety. 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 Due to the severity of her ds (complicated by CHF), I suggested either RAI tx or surgery. She had RAI tx 05/16/2014 >> sxs resolved except for tremors. At last OV, as TFTs were still thyrotoxic, I suggested another RAI tx. For this, she had to have an Uptake and scan >> I ordered this but she did not have this done yet. We need this test for both RAI tx and also to see if her R thyroid nodule is overactive. If "cold", may need to be Bx'ed before RAI tx.  - will recheck TFTs today >> but if tests still abnormal >> needs RAI tx. We absolutely need to avoid thyrotoxicosis 2/2 CHF - will continue beta blockade per cardiology - RTC in 6 mo  2. Right thyroid nodule - Patient had a thyroid ultrasound in 03/2014 showing an enlarged right thyroid lobe, which is virtually replaced by a right thyroid nodule. At that point, pt was thyrotoxic. - she denies neck compression sxs - we may need a repeat U/S in 1 year after she becomes euthyroid. - for now, I would like to obtain an uptake and scan to see if the nodule is hot. If so >> low risk for  ThyCA.  Daughter's phone nr: [phone] - to call with results. Also, pt prefers a letter.  Carlus Pavlov, MD PhD [facility] Endocrinology

## 2016-09-02 ENCOUNTER — Other Ambulatory Visit: Payer: Self-pay | Admitting: Cardiology

## 2016-09-03 NOTE — Telephone Encounter (Signed)
Pharmacy is requesting a refill on Methimazole 5 mg tablet. Would you like to refill this medication? Please advise

## 2016-09-24 ENCOUNTER — Other Ambulatory Visit: Payer: Self-pay | Admitting: Cardiology

## 2016-09-28 ENCOUNTER — Other Ambulatory Visit: Payer: Self-pay | Admitting: Cardiology

## 2016-09-30 NOTE — Telephone Encounter (Signed)
Pt's pharmacy requesting a refill on Methimazole. Would you like to refill this medication? Please advise

## 2016-10-28 ENCOUNTER — Other Ambulatory Visit: Payer: Self-pay | Admitting: Cardiology

## 2016-11-26 ENCOUNTER — Other Ambulatory Visit: Payer: Self-pay | Admitting: Cardiology

## 2016-11-28 ENCOUNTER — Telehealth: Payer: Self-pay | Admitting: Cardiology

## 2016-11-28 MED ORDER — LISINOPRIL 10 MG PO TABS: 10.0000 mg | ORAL_TABLET | Freq: Every day | ORAL | 2 refills | Status: AC

## 2016-11-28 NOTE — Telephone Encounter (Signed)
New message     *STAT* If patient is at the pharmacy, call can be transferred to refill team.   1. Which medications need to be refilled? (please list name of each medication and dose if known) methimazole (TAPAZOLE) 5 MG tablet  2. Which pharmacy/location (including street and city if local pharmacy) is medication to be sent to? [facility]  3. Do they need a 30 day or 90 day supply? 30 day supply

## 2016-11-28 NOTE — Telephone Encounter (Signed)
New message     *STAT* If patient is at the pharmacy, call can be transferred to refill team.   1. Which medications need to be refilled? (please list name of each medication and dose if known) lisinopril (PRINIVIL,ZESTRIL) 10 MG tablet  2. Which pharmacy/location (including street and city if local pharmacy) is medication to be sent to? [facility]  3. Do they need a 30 day or 90 day supply? 30 days

## 2016-11-29 ENCOUNTER — Other Ambulatory Visit: Payer: Self-pay | Admitting: Cardiology

## 2016-12-12 ENCOUNTER — Other Ambulatory Visit: Payer: Self-pay | Admitting: Cardiology

## 2017-02-16 ENCOUNTER — Other Ambulatory Visit: Payer: Self-pay

## 2017-02-16 ENCOUNTER — Observation Stay (HOSPITAL_COMMUNITY)
Admission: EM | Admit: 2017-02-16 | Discharge: 2017-02-18 | Disposition: A | Discharge status: Home / Self Care | Payer: Medicare Other | Attending: Internal Medicine | Admitting: Internal Medicine

## 2017-02-16 ENCOUNTER — Encounter (HOSPITAL_COMMUNITY): Payer: Self-pay | Admitting: Emergency Medicine

## 2017-02-16 ENCOUNTER — Emergency Department (HOSPITAL_COMMUNITY): Payer: Medicare Other

## 2017-02-16 DIAGNOSIS — I5022 Chronic systolic (congestive) heart failure: Secondary | ICD-10-CM | POA: Diagnosis not present

## 2017-02-16 DIAGNOSIS — Z7982 Long term (current) use of aspirin: Secondary | ICD-10-CM | POA: Insufficient documentation

## 2017-02-16 DIAGNOSIS — I428 Other cardiomyopathies: Secondary | ICD-10-CM | POA: Insufficient documentation

## 2017-02-16 DIAGNOSIS — R7989 Other specified abnormal findings of blood chemistry: Secondary | ICD-10-CM | POA: Diagnosis present

## 2017-02-16 DIAGNOSIS — R0600 Dyspnea, unspecified: Secondary | ICD-10-CM

## 2017-02-16 DIAGNOSIS — Z8249 Family history of ischemic heart disease and other diseases of the circulatory system: Secondary | ICD-10-CM | POA: Diagnosis not present

## 2017-02-16 DIAGNOSIS — R778 Other specified abnormalities of plasma proteins: Secondary | ICD-10-CM | POA: Diagnosis present

## 2017-02-16 DIAGNOSIS — Z9119 Patient's noncompliance with other medical treatment and regimen: Secondary | ICD-10-CM | POA: Diagnosis not present

## 2017-02-16 DIAGNOSIS — I110 Hypertensive heart disease with heart failure: Secondary | ICD-10-CM | POA: Insufficient documentation

## 2017-02-16 DIAGNOSIS — E0590 Thyrotoxicosis, unspecified without thyrotoxic crisis or storm: Secondary | ICD-10-CM | POA: Diagnosis not present

## 2017-02-16 DIAGNOSIS — I272 Pulmonary hypertension, unspecified: Secondary | ICD-10-CM | POA: Diagnosis not present

## 2017-02-16 DIAGNOSIS — J9601 Acute respiratory failure with hypoxia: Secondary | ICD-10-CM | POA: Diagnosis not present

## 2017-02-16 DIAGNOSIS — E669 Obesity, unspecified: Secondary | ICD-10-CM | POA: Diagnosis not present

## 2017-02-16 DIAGNOSIS — E0591 Thyrotoxicosis, unspecified with thyrotoxic crisis or storm: Secondary | ICD-10-CM | POA: Diagnosis not present

## 2017-02-16 DIAGNOSIS — I700 Atherosclerosis of aorta: Secondary | ICD-10-CM | POA: Diagnosis not present

## 2017-02-16 DIAGNOSIS — Z6837 Body mass index (BMI) 37.0-37.9, adult: Secondary | ICD-10-CM | POA: Insufficient documentation

## 2017-02-16 DIAGNOSIS — I472 Ventricular tachycardia: Secondary | ICD-10-CM | POA: Diagnosis not present

## 2017-02-16 DIAGNOSIS — R8290 Unspecified abnormal findings in urine: Secondary | ICD-10-CM | POA: Diagnosis not present

## 2017-02-16 DIAGNOSIS — E0511 Thyrotoxicosis with toxic single thyroid nodule with thyrotoxic crisis or storm: Secondary | ICD-10-CM

## 2017-02-16 DIAGNOSIS — I10 Essential (primary) hypertension: Secondary | ICD-10-CM | POA: Diagnosis present

## 2017-02-16 DIAGNOSIS — I4729 Other ventricular tachycardia: Secondary | ICD-10-CM

## 2017-02-16 HISTORY — DX: Thyrotoxicosis, unspecified without thyrotoxic crisis or storm: E05.90

## 2017-02-16 HISTORY — DX: Obesity, unspecified: E66.9

## 2017-02-16 LAB — BASIC METABOLIC PANEL
ANION GAP: 10 (ref 5–15)
Anion gap: 11 (ref 5–15)
Anion gap: 10 (ref 5–15)
BUN: 18 mg/dL (ref 6–20)
BUN: 20 mg/dL (ref 6–20)
BUN: 24 mg/dL — AB (ref 6–20)
CALCIUM: 9.2 mg/dL (ref 8.9–10.3)
CHLORIDE: 108 mmol/L (ref 101–111)
CHLORIDE: 106 mmol/L (ref 101–111)
CO2: 21 mmol/L — ABNORMAL LOW (ref 22–32)
CO2: 19 mmol/L — ABNORMAL LOW (ref 22–32)
CO2: 19 mmol/L — ABNORMAL LOW (ref 22–32)
CREATININE: 0.69 mg/dL (ref 0.44–1.00)
CREATININE: 0.81 mg/dL (ref 0.44–1.00)
Calcium: 9.2 mg/dL (ref 8.9–10.3)
Calcium: 9 mg/dL (ref 8.9–10.3)
Chloride: 109 mmol/L (ref 101–111)
Creatinine, Ser: 0.67 mg/dL (ref 0.44–1.00)
GFR calc Af Amer: >60 mL/min (ref >60)
GFR calc Af Amer: >60 mL/min (ref >60)
GFR calc Af Amer: >60 mL/min (ref >60)
GFR calc non Af Amer: >60 mL/min (ref >60)
GLUCOSE: 128 mg/dL — AB (ref 65–99)
Glucose, Bld: 131 mg/dL — ABNORMAL HIGH (ref 65–99)
Glucose, Bld: 171 mg/dL — ABNORMAL HIGH (ref 65–99)
POTASSIUM: 3.5 mmol/L (ref 3.5–5.1)
Potassium: 3.5 mmol/L (ref 3.5–5.1)
Potassium: 3.4 mmol/L — ABNORMAL LOW (ref 3.5–5.1)
SODIUM: 140 mmol/L (ref 135–145)
SODIUM: 138 mmol/L (ref 135–145)
SODIUM: 135 mmol/L (ref 135–145)

## 2017-02-16 LAB — HEPATIC FUNCTION PANEL
ALK PHOS: 93 U/L (ref 38–126)
ALT: 9 U/L — AB (ref 14–54)
AST: 22 U/L (ref 15–41)
Albumin: 3.7 g/dL (ref 3.5–5.0)
BILIRUBIN DIRECT: 0.3 mg/dL (ref 0.1–0.5)
BILIRUBIN INDIRECT: 1.2 mg/dL — AB (ref 0.3–0.9)
BILIRUBIN TOTAL: 1.5 mg/dL — AB (ref 0.3–1.2)
Total Protein: 8 g/dL (ref 6.5–8.1)

## 2017-02-16 LAB — CBC
HEMATOCRIT: 44.4 % (ref 36.0–46.0)
Hemoglobin: 14.8 g/dL (ref 12.0–15.0)
MCH: 27.9 pg (ref 26.0–34.0)
MCHC: 33.3 g/dL (ref 30.0–36.0)
MCV: 83.6 fL (ref 78.0–100.0)
Platelets: 271 10*3/uL (ref 150–400)
RBC: 5.31 MIL/uL — ABNORMAL HIGH (ref 3.87–5.11)
RDW: 13.2 % (ref 11.5–15.5)
WBC: 6 10*3/uL (ref 4.0–10.5)

## 2017-02-16 LAB — URINALYSIS, ROUTINE W REFLEX MICROSCOPIC
BILIRUBIN URINE: NEGATIVE
Glucose, UA: NEGATIVE mg/dL
KETONES UR: 20 mg/dL — AB
Nitrite: POSITIVE — AB
PROTEIN: 100 mg/dL — AB
Specific Gravity, Urine: 1.028 (ref 1.005–1.030)
pH: 5 (ref 5.0–8.0)

## 2017-02-16 LAB — I-STAT TROPONIN, ED: TROPONIN I, POC: 0.27 ng/mL — AB (ref 0.00–0.08)

## 2017-02-16 LAB — GLUCOSE, CAPILLARY: Glucose-Capillary: 133 mg/dL — ABNORMAL HIGH (ref 65–99)

## 2017-02-16 LAB — TROPONIN I
Troponin I: 0.28 ng/mL (ref <0.03)
Troponin I: 0.25 ng/mL (ref <0.03)

## 2017-02-16 LAB — T4, FREE: Free T4: 3.48 ng/dL — ABNORMAL HIGH (ref 0.61–1.12)

## 2017-02-16 LAB — MRSA PCR SCREENING: MRSA BY PCR: NEGATIVE

## 2017-02-16 LAB — TSH: TSH: 0.002 u[IU]/mL — ABNORMAL LOW (ref 0.350–4.500)

## 2017-02-16 LAB — PHOSPHORUS: Phosphorus: 3.1 mg/dL (ref 2.5–4.6)

## 2017-02-16 LAB — MAGNESIUM: MAGNESIUM: 1.8 mg/dL (ref 1.7–2.4)

## 2017-02-16 LAB — BRAIN NATRIURETIC PEPTIDE: B NATRIURETIC PEPTIDE 5: 381.7 pg/mL — AB (ref 0.0–100.0)

## 2017-02-16 MED ORDER — LORAZEPAM 2 MG/ML IJ SOLN
2.0000 mg | Freq: Once | INTRAMUSCULAR | Status: AC
  Administered 2017-02-16: 2 mg via INTRAVENOUS

## 2017-02-16 MED ORDER — IODINE STRONG (LUGOLS) 5 % PO SOLN
0.2000 mL | Freq: Three times a day (TID) | ORAL | Status: DC
  Administered 2017-02-16 – 2017-02-18 (×6): 0.2 mL via ORAL
  Filled 2017-02-16 (×9): qty 0.2

## 2017-02-16 MED ORDER — ACETAMINOPHEN 650 MG RE SUPP: 650.0000 mg | Freq: Four times a day (QID) | RECTAL | Status: DC | PRN

## 2017-02-16 MED ORDER — LORAZEPAM 2 MG/ML IJ SOLN
INTRAMUSCULAR | Status: AC
  Filled 2017-02-16: qty 1

## 2017-02-16 MED ORDER — METHIMAZOLE 5 MG PO TABS
5.0000 mg | ORAL_TABLET | Freq: Once | ORAL | Status: DC
  Filled 2017-02-16: qty 1

## 2017-02-16 MED ORDER — PROPRANOLOL HCL 20 MG PO TABS
20.0000 mg | ORAL_TABLET | Freq: Four times a day (QID) | ORAL | Status: DC
  Filled 2017-02-16: qty 1

## 2017-02-16 MED ORDER — MAGNESIUM OXIDE 400 (241.3 MG) MG PO TABS
400.0000 mg | ORAL_TABLET | Freq: Two times a day (BID) | ORAL | Status: DC
  Administered 2017-02-16 – 2017-02-18 (×5): 400 mg via ORAL
  Filled 2017-02-16 (×5): qty 1

## 2017-02-16 MED ORDER — POTASSIUM IODIDE (EXPECTORANT) 1 GM/ML PO SOLN
50.0000 mg | Freq: Four times a day (QID) | ORAL | Status: DC
  Filled 2017-02-16: qty 30

## 2017-02-16 MED ORDER — METHIMAZOLE 10 MG PO TABS
20.0000 mg | ORAL_TABLET | Freq: Three times a day (TID) | ORAL | Status: DC
  Administered 2017-02-16 – 2017-02-17 (×6): 20 mg via ORAL
  Filled 2017-02-16 (×7): qty 2

## 2017-02-16 MED ORDER — ACETAMINOPHEN 325 MG PO TABS: 650.0000 mg | ORAL_TABLET | Freq: Four times a day (QID) | ORAL | Status: DC | PRN

## 2017-02-16 MED ORDER — PROPRANOLOL HCL 60 MG PO TABS
60.0000 mg | ORAL_TABLET | Freq: Four times a day (QID) | ORAL | Status: DC
  Administered 2017-02-16 – 2017-02-18 (×9): 60 mg via ORAL
  Filled 2017-02-16 (×12): qty 1

## 2017-02-16 MED ORDER — SODIUM CHLORIDE 0.9% FLUSH
3.0000 mL | Freq: Two times a day (BID) | INTRAVENOUS | Status: DC
  Administered 2017-02-16 – 2017-02-18 (×5): 3 mL via INTRAVENOUS

## 2017-02-16 MED ORDER — ASPIRIN 81 MG PO CHEW
324.0000 mg | CHEWABLE_TABLET | Freq: Once | ORAL | Status: AC
  Administered 2017-02-16: 324 mg via ORAL
  Filled 2017-02-16: qty 4

## 2017-02-16 MED ORDER — ENOXAPARIN SODIUM 40 MG/0.4ML ~~LOC~~ SOLN
40.0000 mg | SUBCUTANEOUS | Status: DC
  Administered 2017-02-16 – 2017-02-18 (×3): 40 mg via SUBCUTANEOUS
  Filled 2017-02-16 (×4): qty 0.4

## 2017-02-16 MED ORDER — ASPIRIN 81 MG PO CHEW
81.0000 mg | CHEWABLE_TABLET | Freq: Every day | ORAL | Status: DC
  Administered 2017-02-17 – 2017-02-18 (×2): 81 mg via ORAL
  Filled 2017-02-16 (×2): qty 1

## 2017-02-16 MED ORDER — HYDROCORTISONE NA SUCCINATE PF 100 MG IJ SOLR
100.0000 mg | Freq: Three times a day (TID) | INTRAMUSCULAR | Status: DC
  Administered 2017-02-16 – 2017-02-18 (×6): 100 mg via INTRAVENOUS
  Filled 2017-02-16 (×6): qty 2

## 2017-02-16 MED ORDER — HYDRALAZINE HCL 20 MG/ML IJ SOLN
10.0000 mg | INTRAMUSCULAR | Status: DC | PRN
  Administered 2017-02-16: 10 mg via INTRAVENOUS
  Filled 2017-02-16: qty 1

## 2017-02-16 MED ORDER — PROPRANOLOL HCL 10 MG PO TABS
10.0000 mg | ORAL_TABLET | Freq: Once | ORAL | Status: DC
  Filled 2017-02-16: qty 1

## 2017-02-16 MED ORDER — FUROSEMIDE 10 MG/ML IJ SOLN: 80.0000 mg | Freq: Once | INTRAMUSCULAR | Status: DC

## 2017-02-16 NOTE — ED Triage Notes (Signed)
Pt transported from home by EMS with shob onset 515 to use BR and became shob, pt denies pain, denies cough. Pt states just prior while watching TV in bed she did not feel shob

## 2017-02-16 NOTE — ED Notes (Signed)
Pt to xray

## 2017-02-16 NOTE — ED Provider Notes (Signed)
[facility] Provider Note   CSN: [id number] Arrival date & time: 02/16/17  [id number]     History   Chief Complaint Chief Complaint  Patient presents with  . Shortness of Breath    HPI Leslie Kline is a 76 y.o. female.  HPI 76 year old female who presents with shortness of breath.  Symptoms started this morning when she was getting out of the bed to use the bathroom.  States that she became very winded, and symptoms improved with rest.  She has a history of nonischemic cardiomyopathy with EF of 35-40% in 2016 thought to be related to Graves' disease/thyrotoxicosis.  She states that she stopped taking her medications for her heart and her thyroid about 2 months ago because she was feeling a lot better and she did not think she needed them.  She has been feeling like her normal self up until this morning.  Denies any fever, cough, chest pain, lower extremity edema, orthopnea, PND, abdominal pain or distention, nausea, vomiting, or diarrhea.   Past Medical History:  Diagnosis Date  . Anasarca    related to combination of heart failure and uncontrolled hyperthyroidism  . Anemia   . Chronic systolic heart failure (HCC)   . Goiter    R sided, s/p L side thyroidectomy. R side goiter likely need biopsy in the future  . Hypertension   . Hypokalemia   . Nonischemic cardiomyopathy (HCC)    EF 20-25% Jan 2016, cath 04/18/2014 normal coronary. Likely result of uncontrolled hyperthyroidism  . Nonsustained ventricular tachycardia (HCC)    no persistent v-tach, asymptomatic, likely related to LV dysfunction, placed on Lifevest  . Prolonged Q-T interval on ECG     Patient Active Problem List   Diagnosis Date Noted  . 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 Graves disease 08/15/2015  . 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 Normal coronary arteries 04/19/2014  . Nonischemic cardiomyopathy (HCC) 04/19/2014  . Pulmonary HTN - moderate  04/19/2014  . Anasarca   . Essential hypertension   . Hypernatremia   . 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 Prolonged QT interval 04/15/2014  . Hypomagnesemia 04/15/2014  . Hypokalemia 04/15/2014  . Anemia 04/15/2014  . Dyspnea   . Acute systolic CHF (congestive heart failure) (HCC) 04/13/2014    Past Surgical History:  Procedure Laterality Date  . ABDOMINAL HYSTERECTOMY    . goiter    . LEFT AND RIGHT HEART CATHETERIZATION WITH CORONARY ANGIOGRAM N/A 04/18/2014   Procedure: LEFT AND RIGHT HEART CATHETERIZATION WITH CORONARY ANGIOGRAM;  Surgeon: Peter M SwazilandJordan, MD;  [facility];  Service: Cardiovascular;  Laterality: N/A;  . TONSILLECTOMY    . TUBAL LIGATION      OB History    No data available       Home Medications    Prior to Admission medications   Medication Sig Start Date End Date Taking? Authorizing Provider  aspirin EC 81 MG EC tablet Take 1 tablet (81 mg total) by mouth daily. 04/21/14   Azalee CourseMeng, Hao, PA  carvedilol (COREG) 12.5 MG tablet TAKE 1 TABLET BY MOUTH TWICE DAILY WITH A MEAL 01/16/16   Jake BatheSkains, Mark C, MD  carvedilol (COREG) 12.5 MG tablet TAKE 1 TABLET BY MOUTH TWICE DAILY WITH A MEAL 01/16/16   Jake BatheSkains, Mark C, MD  furosemide (LASIX) 80 MG tablet TAKE 1 TABLET BY MOUTH EVERY DAY 02/15/15   Jake BatheSkains, Mark C, MD  furosemide (LASIX) 80 MG tablet Take  1 tablet (80 mg total) by mouth daily. Patient overdue for an appointment. Must call and schedule for further refills 3rd/final attempt 12/12/16   Jake Bathe, MD  lisinopril (PRINIVIL,ZESTRIL) 10 MG tablet Take 1 tablet (10 mg total) by mouth daily. Over due for follow up  For future refills needs an appt 11/28/16   Jake Bathe, MD  MAGNESIUM-OXIDE 400 (241.3 Mg) MG tablet TAKE 1 TABLET(400 MG) BY MOUTH DAILY 03/14/16   Carlus Pavlov, MD  methimazole (TAPAZOLE) 5 MG tablet Take 1 tablet (5 mg total) by mouth daily. Please call to schedule appointment 2nd attempt. [phone]) 11/29/16    Jake Bathe, MD  potassium chloride SA (K-DUR,KLOR-CON) 20 MEQ tablet TAKE 2 TABLETS BY MOUTH DAILY 12/19/15   Jake Bathe, MD    Family History Family History  Problem Relation Age of Onset  . Heart disease Mother   . Diabetes Mother   . Diabetes Maternal Grandfather     Social History Social History   Tobacco Use  . Smoking status: Never Smoker  . Smokeless tobacco: Never Used  Substance Use Topics  . Alcohol use: No  . Drug use: No     Allergies   Patient has no known allergies.   Review of Systems Review of Systems  Constitutional: Negative for fever.  Respiratory: Positive for shortness of breath. Negative for cough.   Cardiovascular: Negative for chest pain.  Gastrointestinal: Negative for abdominal pain, diarrhea and vomiting.  All other systems reviewed and are negative.    Physical Exam Updated Vital Signs BP (!) 135/120 (BP Location: Left Arm)   Pulse (!) 120   Temp 98.3 F (36.8 C) (Axillary)   Resp (!) 22   SpO2 95%   Physical Exam  Physical Exam  Nursing note and vitals reviewed. Constitutional: Non-toxic, and in no acute distress Head: Normocephalic and atraumatic.  Mouth/Throat: Oropharynx is clear and moist.  Neck: Normal range of motion. Neck supple.  Cardiovascular: Tachycardic rate and regular rhythm.   Pulmonary/Chest: Effort normal and breath sounds normal.  Abdominal: Soft. There is no tenderness. There is no rebound and no guarding.  Musculoskeletal: No edema.  Neurological: Alert, no facial droop, fluent speech, moves all extremities symmetrically Skin: Skin is warm and dry.  Psychiatric: Cooperative   ED Treatments / Results  Labs (all labs ordered are listed, but only abnormal results are displayed) Labs Reviewed  BASIC METABOLIC PANEL - Abnormal; Notable for the following components:      Result Value   CO2 21 (*)    Glucose, Bld 128 (*)    All other components within normal limits  CBC - Abnormal; Notable for the  following components:   RBC 5.31 (*)    All other components within normal limits  TSH - Abnormal; Notable for the following components:   TSH 0.002 (*)    All other components within normal limits  T4, FREE - Abnormal; Notable for the following components:   Free T4 3.48 (*)    All other components within normal limits  HEPATIC FUNCTION PANEL - Abnormal; Notable for the following components:   ALT 9 (*)    Total Bilirubin 1.5 (*)    Indirect Bilirubin 1.2 (*)    All other components within normal limits  BRAIN NATRIURETIC PEPTIDE - Abnormal; Notable for the following components:   B Natriuretic Peptide 381.7 (*)    All other components within normal limits  I-STAT TROPONIN, ED - Abnormal; Notable for the following  components:   Troponin i, poc 0.27 (*)    All other components within normal limits  THYROID ANTIBODIES  TROPONIN I  TROPONIN I  TROPONIN I    EKG  EKG Interpretation None       Radiology Dg Chest 2 View  Result Date: 02/16/2017 CLINICAL DATA:  76 year old female with history of shortness breath since this morning. EXAM: CHEST  2 VIEW COMPARISON:  Chest x-ray 04/13/2014. FINDINGS: Lung volumes are normal. No consolidative airspace disease. No pleural effusions. No pneumothorax. No pulmonary nodule or mass noted. Pulmonary vasculature and the cardiomediastinal silhouette are within normal limits. Atherosclerosis in the thoracic aorta. IMPRESSION: 1.  No radiographic evidence of acute cardiopulmonary disease. 2. Aortic atherosclerosis. Electronically Signed   By: Trudie Reed M.D.   On: 02/16/2017 07:48    Procedures Procedures (including critical care time)  Medications Ordered in ED Medications  aspirin chewable tablet 324 mg (not administered)  propranolol (INDERAL) tablet 10 mg (not administered)     Initial Impression / Assessment and Plan / ED Course  I have reviewed the triage vital signs and the nursing notes.  Pertinent labs & imaging results  that were available during my care of the patient were reviewed by me and considered in my medical decision making (see chart for details).     76 year old female who presents with dyspnea on exertion.  She is nontoxic in no acute distress but is tachycardic with heart rate in the 120s on arrival.  Normotensive.  Placed on supplemental oxygen for pulse ox in the low 90s but she does not appear acutely dyspneic.  She does not look fluid overloaded on exam and chest x-ray is visualized and shows no acute cardiopulmonary processes.  There is recurrence of her thyrotoxicosis given her tachycardia, low TSH and high free T4.  She also has evidence of heart strain likely from thyrotoxicosis with a troponin of 0.27.  Her EKG does show some new ST and T wave changes inferolaterally.  I think this is related to her hyperdynamic state, and also reviewed this with Dr. Eden Emms who agrees and recommends treatment for her thyrotoxicosis first.  Written for propranolol.  We will hold methimazole currently per hospitalist service as they would like to do more thyroid testing.  Discussed with Junious Silk who will admit to hospitalist service.  Final Clinical Impressions(s) / ED Diagnoses   Final diagnoses:  Thyrotoxicosis without thyroid storm, unspecified thyrotoxicosis type    ED Discharge Orders    None       Lavera Guise, MD 02/16/17 [phone]

## 2017-02-16 NOTE — ED Notes (Signed)
Dr.Liu shown results of istat troponin. Ed-Lab

## 2017-02-16 NOTE — H&P (Signed)
History and Physical    Leslie Kline NWG:[id number] DOB: 21-Feb-1941 DOA: 02/16/2017   PCP: Carlus Pavlov, MD   Attending physician: Joseph Art  Patient coming from/Resides with: Private residence  Chief Complaint: Shortness of breath  HPI: Leslie Kline is a 76 y.o. female with medical history significant for Graves' disease with previous RAI treatment in 2016 for very large right thyroid nodule.  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  Last echocardiogram 2016 with an EF of 35-40%.  She was last evaluated by her endocrinologist on 02/19/16.  Patient had remained on methimazole 5 mg daily secondary to abnormal TFTs.  The endocrinologist initial plan was to add radioactive iodine but wanted to obtain a thyroid uptake and scan to check for Graves' activity with plans to biopsy her nodule if it appeared cold before beginning RAI treatment.  The endocrinologist documented that unfortunately communication with the patient was hindered by the fact that they had to contact patient either by letter/mail or contact her daughter.  Review of the outpatient documentation patient has not had the uptake scan and has not seen her endocrinologist since that visit.  The patient was sent to the ER today by EMS because of shortness of breath/dyspnea on exertion.  She was quite tachycardic at presentation with heart rate in the 130s and she was normotensive, actually hypertensive.  Her chest x-ray did not demonstrate evidence of CHF and her BNP was stable at 382.  Because of her history of hyperthyroidism TFTs were obtained and were abnormal: TSH was almost undetectable at 0.002 and free T4 was elevated at 3.48.  When further questioned by the EDP the patient admitted for the past 3 months she had not taken any of her medications including her cardiac medications and her methimazole stating "I felt better so I just stopped them".  EDP has ordered a dose of  Inderal and we plan to admit the patient to the stepdown unit due to concerns of possible impending thyroid storm.  Patient also had mildly elevated troponin.  EDP also spoke to cardiologist on call who suggested that it is not unreasonable to have elevated troponin i.e. demand ischemia in the setting of thyrotoxicosis.  ED Course:  Vital Signs: BP (!) 135/120 (BP Location: Left Arm)   Pulse (!) 120   Temp 98.3 F (36.8 C) (Axillary)   Resp (!) 22   SpO2 95%  2 view chest x-ray: No radiographic evidence of acute cardiopulmonary disease Lab data: Sodium 140, potassium 3.5, chloride 109, CO2 21, glucose 128, BUN 18, creatinine 0.67, LFTs normal except for slightly elevated total bilirubin at 1.5 with indirect bilirubin 1.2, BNP 382, poc troponin 0 0.27, white count 6000 differential not obtained, hemoglobin 14.8, platelets 271,000, TSH 0.002, free T4 3.48 Medications and treatments: Inderal initially ordered as 10 mg x1 dose-after review of the literature we have changed this dosage to 60 mg every 6 hours  Review of Systems:  In addition to the HPI above,  No Fever-chills, myalgias or other constitutional symptoms No Headache, changes with Vision or hearing, new weakness, tingling, numbness in any extremity, dizziness, dysarthria or word finding difficulty, gait disturbance or imbalance, tremors or seizure activity No problems swallowing food or Liquids, indigestion/reflux, choking or coughing while eating, abdominal pain with or after eating No Chest pain, Cough, palpitations or orthopnea  No Abdominal pain, N/V, melena,hematochezia, dark tarry stools, constipation No dysuria, malodorous urine, hematuria or flank pain No new  skin rashes, lesions, masses or bruises, No new joint pains, aches, swelling or redness No recent unintentional weight loss No polyuria, polydypsia or polyphagia   Past Medical History:  Diagnosis Date  . Anasarca    related to combination of heart failure and  uncontrolled hyperthyroidism  . Anemia   . Chronic systolic heart failure (HCC)   . Goiter    R sided, s/p L side thyroidectomy. R side goiter likely need biopsy in the future  . Hypertension   . Hypokalemia   . Nonischemic cardiomyopathy (HCC)    EF 20-25% Jan 2016, cath 04/18/2014 normal coronary. Likely result of uncontrolled hyperthyroidism  . Nonsustained ventricular tachycardia (HCC)    no persistent v-tach, asymptomatic, likely related to LV dysfunction, placed on Lifevest  . Prolonged Q-T interval on ECG     Past Surgical History:  Procedure Laterality Date  . ABDOMINAL HYSTERECTOMY    . goiter    . LEFT AND RIGHT HEART CATHETERIZATION WITH CORONARY ANGIOGRAM N/A 04/18/2014   Procedure: LEFT AND RIGHT HEART CATHETERIZATION WITH CORONARY ANGIOGRAM;  Surgeon: Peter M SwazilandJordan, MD;  [facility];  Service: Cardiovascular;  Laterality: N/A;  . TONSILLECTOMY    . TUBAL LIGATION      Social History   Socioeconomic History  . Marital status: Widowed    Spouse name: Not on file  . Number of children: Not on file  . Years of education: Not on file  . Highest education level: Not on file  Social Needs  . Financial resource strain: Not on file  . Food insecurity - worry: Not on file  . Food insecurity - inability: Not on file  . Transportation needs - medical: Not on file  . Transportation needs - non-medical: Not on file  Occupational History  . Not on file  Tobacco Use  . Smoking status: Never Smoker  . Smokeless tobacco: Never Used  Substance and Sexual Activity  . Alcohol use: No  . Drug use: No  . Sexual activity: Not on file  Other Topics Concern  . Not on file  Social History Narrative  . Not on file    Mobility: Independent Work history: Not obtained   No Known Allergies  Family History  Problem Relation Age of Onset  . Heart disease Mother   . Diabetes Mother   . Diabetes Maternal Grandfather     Prior to Admission medications   Medication  Sig Start Date End Date Taking? Authorizing Provider  aspirin EC 81 MG EC tablet Take 1 tablet (81 mg total) by mouth daily. 04/21/14  Yes Meng, Wynema BirchHao, PA  carvedilol (COREG) 12.5 MG tablet TAKE 1 TABLET BY MOUTH TWICE DAILY WITH A MEAL 01/16/16  Yes Jake BatheSkains, Mark C, MD  carvedilol (COREG) 12.5 MG tablet TAKE 1 TABLET BY MOUTH TWICE DAILY WITH A MEAL 01/16/16  Yes Jake BatheSkains, Mark C, MD  furosemide (LASIX) 80 MG tablet TAKE 1 TABLET BY MOUTH EVERY DAY 02/15/15  Yes Jake BatheSkains, Mark C, MD  furosemide (LASIX) 80 MG tablet Take 1 tablet (80 mg total) by mouth daily. Patient overdue for an appointment. Must call and schedule for further refills 3rd/final attempt 12/12/16  Yes Skains, Veverly FellsMark C, MD  lisinopril (PRINIVIL,ZESTRIL) 10 MG tablet Take 1 tablet (10 mg total) by mouth daily. Over due for follow up  For future refills needs an appt 11/28/16  Yes Jake BatheSkains, Mark C, MD  MAGNESIUM-OXIDE 400 (241.3 Mg) MG tablet TAKE 1 TABLET(400 MG) BY MOUTH DAILY 03/14/16  Yes Carlus Pavlov, MD  methimazole (TAPAZOLE) 5 MG tablet Take 1 tablet (5 mg total) by mouth daily. Please call to schedule appointment 2nd attempt. [phone]) 11/29/16  Yes Jake Bathe, MD  potassium chloride SA (K-DUR,KLOR-CON) 20 MEQ tablet TAKE 2 TABLETS BY MOUTH DAILY 12/19/15  Yes Jake Bathe, MD    Physical Exam: Vitals:   02/16/17 0654 02/16/17 0710 02/16/17 0711  BP:   (!) 135/120  Pulse:   (!) 120  Resp:   (!) 22  Temp:   98.3 F (36.8 C)  TempSrc:   Axillary  SpO2: 90% 95% 95%      Constitutional: NAD, calm, comfortable Eyes: PERRL, lids and conjunctivae normal ENMT: Mucous membranes are moist. Posterior pharynx clear of any exudate or lesions.age-appropriate dentition.  Neck: normal, supple on the left; has a visible enlarged thyroid gland on the right with a flat, firm palpable gland on the right which is nontender Respiratory: clear to auscultation bilaterally, no wheezing, no crackles. Normal respiratory effort. No accessory  muscle use.  Oxygen in place.  Room air saturations 94-95% at rest with O2 sats 90% with exertion Cardiovascular: Regular but tachycardic rate and rhythm-resting tachycardia in the 110-120 range with exertional tachycardia greater than 130 bpm, no murmurs / rubs / gallops. No extremity edema. 2+ pedal pulses. No carotid bruits.  Abdomen: no tenderness, no masses palpated. No hepatosplenomegaly. Bowel sounds positive.  Musculoskeletal: no clubbing / cyanosis. No joint deformity upper and lower extremities. Good ROM, no contractures. Normal muscle tone.  Skin: no rashes, lesions, ulcers. No induration Neurologic: CN 2-12 grossly intact. Sensation intact, DTR normal. Strength 5/5 x all 4 extremities.  Psychiatric: Normal judgment and insight. Alert and oriented x 3. Normal mood.    Labs on Admission: I have personally reviewed following labs and imaging studies  CBC: Recent Labs  Lab 02/16/17 0800  WBC 6.0  HGB 14.8  HCT 44.4  MCV 83.6  PLT 271   Basic Metabolic Panel: Recent Labs  Lab 02/16/17 0800  NA 140  K 3.5  CL 109  CO2 21*  GLUCOSE 128*  BUN 18  CREATININE 0.67  CALCIUM 9.2   GFR: CrCl cannot be calculated (Unknown ideal weight.). Liver Function Tests: Recent Labs  Lab 02/16/17 0800  AST 22  ALT 9*  ALKPHOS 93  BILITOT 1.5*  PROT 8.0  ALBUMIN 3.7   No results for input(s): LIPASE, AMYLASE in the last 168 hours. No results for input(s): AMMONIA in the last 168 hours. Coagulation Profile: No results for input(s): INR, PROTIME in the last 168 hours. Cardiac Enzymes: No results for input(s): CKTOTAL, CKMB, CKMBINDEX, TROPONINI in the last 168 hours. BNP (last 3 results) No results for input(s): PROBNP in the last 8760 hours. HbA1C: No results for input(s): HGBA1C in the last 72 hours. CBG: No results for input(s): GLUCAP in the last 168 hours. Lipid Profile: No results for input(s): CHOL, HDL, LDLCALC, TRIG, CHOLHDL, LDLDIRECT in the last 72  hours. Thyroid Function Tests: Recent Labs    02/16/17 0800  TSH 0.002*  FREET4 3.48*   Anemia Panel: No results for input(s): VITAMINB12, FOLATE, FERRITIN, TIBC, IRON, RETICCTPCT in the last 72 hours. Urine analysis: No results found for: COLORURINE, APPEARANCEUR, LABSPEC, PHURINE, GLUCOSEU, HGBUR, BILIRUBINUR, KETONESUR, PROTEINUR, UROBILINOGEN, NITRITE, LEUKOCYTESUR Sepsis Labs: @LABRCNTIP (procalcitonin:4,lacticidven:4) )No results found for this or any previous visit (from the past 240 hour(s)).   Radiological Exams on Admission: Dg Chest 2 View  Result Date: 02/16/2017 CLINICAL DATA:  77 year old female with history of shortness breath since this morning. EXAM: CHEST  2 VIEW COMPARISON:  Chest x-ray 04/13/2014. FINDINGS: Lung volumes are normal. No consolidative airspace disease. No pleural effusions. No pneumothorax. No pulmonary nodule or mass noted. Pulmonary vasculature and the cardiomediastinal silhouette are within normal limits. Atherosclerosis in the thoracic aorta. IMPRESSION: 1.  No radiographic evidence of acute cardiopulmonary disease. 2. Aortic atherosclerosis. Electronically Signed   By: Trudie Reed M.D.   On: 02/16/2017 07:48    EKG: (Independently reviewed) sinus tachycardia with ventricular rate 120 bpm, QTC 491 ms, voltage criteria consistent with right ventricular hypertrophy and incomplete right bundle branch block; these changes are new compared to previous EKG from August 2017.  Patient has more prominent Q waves in lead III with early repolarization versus ST segment elevation in leads III and aVF as well as V3 and V4, patient also appears to have new T wave inversion in leads V2 through V6-these could possibly be rate related as well  Assessment/Plan Principal Problem:   Hyperthyroidism with evolving storm -Patient presents with shortness of breath, tachycardia, hypertension, mild transaminitis and undetectable TSH all consistent with evolving thyroid  storm -Patient stopped taking methimazole 3 months prior (as well as her carvedilol)-given this timeframe beta-blocker withdrawal would not be causing tachycardia -Previously underwent RAI in 2016 -Has associated large thyroid nodule, enlarged thyroid gland on right; outpatient endocrinologist planned nuclear med thyroid uptake scan to determine if hot or cold nodule and if cold recommended biopsy -Discussed with radiologist who states unable to pursue any nuclear med studies until tomorrow on 11/26 and given concerns over evolution into full-blown thyroid storm we have opted to focus on aggressive medical management noting the medications required to treat thyroid storm will interfere with the uptake scan -Inderal 60 mg every 6 hours to control symptoms/tachycardia secondary to increased adrenergic tone -Lugol's solution 0.2 mL every 8 hours to block the release of thyroid hormone-follow bmet every 6 hours -Hydrocortisone 100 mg IV every 8 hours to reduce T4-T3 conversion, promote vasomotor stability and empirically treat possible relative adrenal insufficiency -Obtain T3 and thyroid antibodies -Repeat LFTs in a.m. noting mild elevation in indirect and total bilirubin at presentation -Telemetry monitoring and stepdown level of care -Recommend contacting patient's primary endocrinologist Dr. Shella Maxim in the a.m. on 11/26 -Monitor for rapid decompensation that would require transfer to the ICU  Active Problems:   Chronic systolic heart failure/NICM -Currently appears compensated with stable BNP and negative chest x-ray, no peripheral edema -Monitor for decompensation with persistent tachycardia -Repeat echocardiogram since last done in 2016 (EF 35-40% with moderate reduction in systolic function, mild to moderate left atrial dilatation, mild dilatation right ventricle) -Patient has new EKG changes concerning for possible right ventricular remodeling -Continue beta-blocker as above-was on  carvedilol at home which is being held for now favor of Inderal -Hold ACE inhibitor in anticipation of need for high-dose beta-blockade to control tachycardia related to thyrotoxicosis-this may result in hypotension if combined with other antihypertensive medications -Hold Lasix as well while attempting to control tachycardia-if develops heart failure symptoms can give IV Lasix as indicated -Daily weight, strict I/O    Acute respiratory failure with hypoxia  -Suspect secondary to demand from persistent tachycardia in context of evolving thyrotoxic storm -Continue oxygen for supportive care    Hypertension -Currently uncontrolled -See above regarding administration of beta-blockers and hold of preadmission ACE inhibitor and diuretic    Elevated troponin -Patient without chest pain and EKG suggestive of right  ventricular cardiac remodeling -EDP discussed with cardiology who agrees that mild elevation in the initial troponin secondary to demand ischemia from persistent tachycardia -We will continue to cycle troponin -Follow-up on echocardiogram    History of Nonsustained ventricular tachycardia -Monitor for recurrence in context of evolving thyroid storm and persistent tachycardia -Check magnesium level -Continue home magnesium     Abnormal UA -no sx's UTI -urine culture      DVT prophylaxis: Lovenox Code Status: Full Family Communication: Multiple family members at bedside Disposition Plan: Home Consults called: None    Ginevra Tacker L. ANP-BC [facility] Pager [phone]   If 7PM-7AM, please contact night-coverage [email or web address] Password [facility]  02/16/2017, 10:37 AM

## 2017-02-17 ENCOUNTER — Encounter (HOSPITAL_COMMUNITY): Payer: Self-pay | Admitting: Physician Assistant

## 2017-02-17 ENCOUNTER — Inpatient Hospital Stay (HOSPITAL_COMMUNITY): Payer: Medicare Other

## 2017-02-17 DIAGNOSIS — I361 Nonrheumatic tricuspid (valve) insufficiency: Secondary | ICD-10-CM | POA: Diagnosis not present

## 2017-02-17 DIAGNOSIS — E0520 Thyrotoxicosis with toxic multinodular goiter without thyrotoxic crisis or storm: Secondary | ICD-10-CM | POA: Diagnosis not present

## 2017-02-17 DIAGNOSIS — E0581 Other thyrotoxicosis with thyrotoxic crisis or storm: Secondary | ICD-10-CM | POA: Diagnosis not present

## 2017-02-17 DIAGNOSIS — I10 Essential (primary) hypertension: Secondary | ICD-10-CM | POA: Diagnosis not present

## 2017-02-17 DIAGNOSIS — I5022 Chronic systolic (congestive) heart failure: Secondary | ICD-10-CM | POA: Diagnosis not present

## 2017-02-17 DIAGNOSIS — I2721 Secondary pulmonary arterial hypertension: Secondary | ICD-10-CM | POA: Diagnosis not present

## 2017-02-17 DIAGNOSIS — J9601 Acute respiratory failure with hypoxia: Secondary | ICD-10-CM

## 2017-02-17 DIAGNOSIS — E0521 Thyrotoxicosis with toxic multinodular goiter with thyrotoxic crisis or storm: Secondary | ICD-10-CM | POA: Diagnosis not present

## 2017-02-17 DIAGNOSIS — I472 Ventricular tachycardia: Secondary | ICD-10-CM

## 2017-02-17 DIAGNOSIS — I110 Hypertensive heart disease with heart failure: Secondary | ICD-10-CM | POA: Diagnosis not present

## 2017-02-17 DIAGNOSIS — R748 Abnormal levels of other serum enzymes: Secondary | ICD-10-CM | POA: Diagnosis not present

## 2017-02-17 DIAGNOSIS — E0591 Thyrotoxicosis, unspecified with thyrotoxic crisis or storm: Secondary | ICD-10-CM | POA: Diagnosis not present

## 2017-02-17 LAB — ECHOCARDIOGRAM COMPLETE
AVLVOTPG: 2 mmHg
CHL CUP TV REG PEAK VELOCITY: 404 cm/s
FS: 13 % — AB (ref 28–44)
HEIGHTINCHES: 62 in
IV/PV OW: 1.01
LA vol A4C: 55.1 ml
LADIAMINDEX: 1.81 cm/m2
LASIZE: 35 mm
LEFT ATRIUM END SYS DIAM: 35 mm
LV PW d: 13.5 mm — AB (ref 0.6–1.1)
LVELAT: 5.91 cm/s
LVOT VTI: 11.3 cm
LVOT area: 3.46 cm2
LVOT diameter: 21 mm
LVOTPV: 70.9 cm/s
LVOTSV: 39 mL
Lateral S' vel: 12.9 cm/s
P 1/2 time: 247 ms
RV TAPSE: 19.1 mm
RV sys press: 80 mmHg
TDI e' lateral: 5.91
TDI e' medial: 4.9
TR max vel: 404 cm/s
WEIGHTICAEL: 3287.5 [oz_av]

## 2017-02-17 LAB — CBC
HCT: 45.7 % (ref 36.0–46.0)
Hemoglobin: 15 g/dL (ref 12.0–15.0)
MCH: 27.5 pg (ref 26.0–34.0)
MCHC: 32.8 g/dL (ref 30.0–36.0)
MCV: 83.7 fL (ref 78.0–100.0)
PLATELETS: 258 10*3/uL (ref 150–400)
RBC: 5.46 MIL/uL — AB (ref 3.87–5.11)
RDW: 13.4 % (ref 11.5–15.5)
WBC: 6.6 10*3/uL (ref 4.0–10.5)

## 2017-02-17 LAB — BASIC METABOLIC PANEL
ANION GAP: 8 (ref 5–15)
Anion gap: 12 (ref 5–15)
Anion gap: 10 (ref 5–15)
BUN: 27 mg/dL — ABNORMAL HIGH (ref 6–20)
BUN: 31 mg/dL — AB (ref 6–20)
BUN: 39 mg/dL — AB (ref 6–20)
CALCIUM: 8.9 mg/dL (ref 8.9–10.3)
CALCIUM: 9.2 mg/dL (ref 8.9–10.3)
CALCIUM: 9.3 mg/dL (ref 8.9–10.3)
CHLORIDE: 106 mmol/L (ref 101–111)
CO2: 17 mmol/L — AB (ref 22–32)
CO2: 21 mmol/L — ABNORMAL LOW (ref 22–32)
CO2: 21 mmol/L — ABNORMAL LOW (ref 22–32)
CREATININE: 0.79 mg/dL (ref 0.44–1.00)
Chloride: 107 mmol/L (ref 101–111)
Chloride: 109 mmol/L (ref 101–111)
Creatinine, Ser: 0.85 mg/dL (ref 0.44–1.00)
Creatinine, Ser: 1.1 mg/dL — ABNORMAL HIGH (ref 0.44–1.00)
GFR calc Af Amer: >60 mL/min (ref >60)
GFR calc Af Amer: 55 mL/min — ABNORMAL LOW (ref >60)
GFR calc non Af Amer: >60 mL/min (ref >60)
GFR, EST NON AFRICAN AMERICAN: 48 mL/min — AB (ref >60)
GLUCOSE: 124 mg/dL — AB (ref 65–99)
GLUCOSE: 127 mg/dL — AB (ref 65–99)
GLUCOSE: 181 mg/dL — AB (ref 65–99)
Potassium: 3.8 mmol/L (ref 3.5–5.1)
Potassium: 4.3 mmol/L (ref 3.5–5.1)
Potassium: 4.1 mmol/L (ref 3.5–5.1)
SODIUM: 138 mmol/L (ref 135–145)
SODIUM: 138 mmol/L (ref 135–145)
Sodium: 135 mmol/L (ref 135–145)

## 2017-02-17 LAB — HEPATIC FUNCTION PANEL
ALK PHOS: 82 U/L (ref 38–126)
ALT: 8 U/L — AB (ref 14–54)
AST: 17 U/L (ref 15–41)
Albumin: 3.5 g/dL (ref 3.5–5.0)
BILIRUBIN DIRECT: 0.2 mg/dL (ref 0.1–0.5)
BILIRUBIN INDIRECT: 1.1 mg/dL — AB (ref 0.3–0.9)
Total Bilirubin: 1.3 mg/dL — ABNORMAL HIGH (ref 0.3–1.2)
Total Protein: 7.6 g/dL (ref 6.5–8.1)

## 2017-02-17 LAB — TROPONIN I: Troponin I: 0.13 ng/mL (ref <0.03)

## 2017-02-17 NOTE — Evaluation (Signed)
Physical Therapy Evaluation Patient Details Name: Leslie CurtisMary Kline MRN: [id number] DOB: 01/22/1941 Today's Date: 02/17/2017   History of Present Illness  76 year old female with history of Graves' disease with previous RAI treatment in 2016 for very large right thyroid nodule, noncompliant with medication, NICM with EF of 35-40% presented on 02/16/2017 with SOB, hyperthyroidism with thyrotoxicosis and pending storm  Clinical Impression  Pt presents with impaired cognition, impaired mobility, decreased balance, and generalized muscular weakness secondary to above. Pt is impulsive and agitated throughout session, insisting that she is more independent with her mobility than she demonstrates. Pt is unable to state her correct birthday, needing verbal cueing to confirm the correct date. Pt will benefit from home health physical therapy upon discharge to assess safe mobility in her home environment. Pt indicates that 3 of her grandchildren live with her and she has intermittent assistance from her daughters. Pt cleans houses for 2 hours/day. PT will follow acutely while in hospital in order to improve upon aforementioned deficits, increase level of independence, and encourage safe mobility.   SpO2 87-100% on RA during PT session.     Follow Up Recommendations Home health PT;Supervision/Assistance - 24 hour    Equipment Recommendations  Rolling walker with 5" wheels    Recommendations for Other Services       Precautions / Restrictions Precautions Precautions: Fall Restrictions Weight Bearing Restrictions: No      Mobility  Bed Mobility Overal bed mobility: Needs Assistance Bed Mobility: Supine to Sit     Supine to sit: Supervision;HOB elevated     General bed mobility comments: Pt able to transfer from supine to sit with increased time and use of railing.   Transfers Overall transfer level: Needs assistance   Transfers: Sit to/from Stand Sit to Stand: Min guard         General  transfer comment: x3 (from EOB and toilet), min guard for safety.   Ambulation/Gait Ambulation/Gait assistance: Min assist Ambulation Distance (Feet): 125 Feet Assistive device: None Gait Pattern/deviations: Drifts right/left;Decreased stride length;Step-through pattern;Trunk flexed Gait velocity: decreased   General Gait Details: Pt demonstrates generally unstable gait, consistently drifting towards walls while ambulating in hallways. Pt reaches for handrails and surrounding surfaces, however pt denies being unsteady. Pt demonstrates SOB during ambulation and directed to take a break. However, pt insistent on continuing ambulation to return to room. Pt reports being very hot with fleece robe on during ambulation. Pt denies need for assistive device, although PT opinion that it would be beneficial.   Stairs            Wheelchair Mobility    Modified Rankin (Stroke Patients Only)       Balance Overall balance assessment: Needs assistance Sitting-balance support: Feet supported;No upper extremity supported Sitting balance-Leahy Scale: Fair Sitting balance - Comments: Pt able to sit EOB without UE support.    Standing balance support: No upper extremity supported;During functional activity Standing balance-Leahy Scale: Fair Standing balance comment: pt demonstrates standing without UE support, however requires min guard assist for safety.                             Pertinent Vitals/Pain Pain Assessment: No/denies pain    Home Living Family/patient expects to be discharged to:: Private residence Living Arrangements: Other relatives(Pt has 3 grandchildren living with her) Available Help at Discharge: Family;Available PRN/intermittently Type of Home: House Home Access: Stairs to enter Entrance Stairs-Rails: Can reach both Entrance Stairs-Number of  Steps: 3 Home Layout: One level Home Equipment: Cane - single point      Prior Function Level of Independence:  Independent         Comments: pt cleans houses for work     Higher education careers adviser        Extremity/Trunk Assessment   Upper Extremity Assessment Upper Extremity Assessment: Generalized weakness    Lower Extremity Assessment Lower Extremity Assessment: Generalized weakness    Cervical / Trunk Assessment Cervical / Trunk Assessment: Kyphotic  Communication      Cognition Arousal/Alertness: Awake/alert Behavior During Therapy: Impulsive Overall Cognitive Status: No family/caregiver present to determine baseline cognitive functioning Area of Impairment: Problem solving;Safety/judgement                         Safety/Judgement: Decreased awareness of safety;Decreased awareness of deficits   Problem Solving: Difficulty sequencing;Requires verbal cues General Comments: pt impulsive with movement despite cues to slow down and wait for therapist or lines. pt could not remember her birthday and was only able to recite correct birthday after verbal cueing from PT. Pt insistent that she does not require any help to walk and she is "fine", however pt very unstable, SOB, and reaching for railings along walls. Pt       General Comments General comments (skin integrity, edema, etc.): Pt's SpO2 ranges from 86-100% on RA throughout session. Pt insists that she will "walk out of here" if told to stay at hospital much longer.     Exercises     Assessment/Plan    PT Assessment Patient needs continued PT services  PT Problem List Decreased strength;Decreased mobility;Decreased safety awareness;Decreased cognition;Decreased balance       PT Treatment Interventions DME instruction;Therapeutic activities;Cognitive remediation;Gait training;Therapeutic exercise;Patient/family education;Balance training;Stair training;Neuromuscular re-education;Functional mobility training;Manual techniques    PT Goals (Current goals can be found in the Care Plan section)  Acute Rehab PT Goals Patient  Stated Goal: return home PT Goal Formulation: With patient Time For Goal Achievement: 03/03/17 Potential to Achieve Goals: Good    Frequency Min 3X/week   Barriers to discharge        Co-evaluation               AM-PAC PT "6 Clicks" Daily Activity  Outcome Measure Difficulty turning over in bed (including adjusting bedclothes, sheets and blankets)?: A Little Difficulty moving from lying on back to sitting on the side of the bed? : A Little Difficulty sitting down on and standing up from a chair with arms (e.g., wheelchair, bedside commode, etc,.)?: A Little Help needed moving to and from a bed to chair (including a wheelchair)?: A Little Help needed walking in hospital room?: A Little Help needed climbing 3-5 steps with a railing? : A Lot 6 Click Score: 17    End of Session Equipment Utilized During Treatment: Gait belt Activity Tolerance: Treatment limited secondary to agitation Patient left: in chair;with call bell/phone within reach Nurse Communication: Mobility status PT Visit Diagnosis: Unsteadiness on feet (R26.81);Other abnormalities of gait and mobility (R26.89);Muscle weakness (generalized) (M62.81)    Time: 1607-3710 PT Time Calculation (min) (ACUTE ONLY): 24 min   Charges:   PT Evaluation $PT Eval Moderate Complexity: 1 Mod PT Treatments $Therapeutic Activity: 8-22 mins   PT G Codes:        Reina Fuse, SPT  Reina Fuse 02/17/2017, 2:22 PM

## 2017-02-17 NOTE — Progress Notes (Signed)
Patient ID: Leslie Kline Uemura, female   DOB: 10/11/1940, 76 y.o.   MRN: [id number]  PROGRESS NOTE    Leslie Kline Lopp  BMW:[id number] DOB: 12/06/1940 DOA: 02/16/2017 PCP: Carlus PavlovGherghe, Cristina, MD   Brief Narrative: 76 year old female with history of Graves' disease with previous RAI treatment in 2016 for very large right thyroid nodule, noncompliant with her medications including methimazole, nonischemic cardiomyopathy with last ejection fraction of 35-40% in echo in 2016 presented on 02/16/2017 with shortness of breath. She was found to be tachycardic with elevated free T4 at 3.48 and almost undetectable TSH at 0.002; she also had mildly positive troponin. ED provider spoke to on call cardiology who suggested that elevated troponin was secondary to demand ischemia in the setting of thyrotoxicosis. Patient was admitted for impending thyroid storm.  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 - Improving. Heart rate better. Currently on propranolol, lugol's iodine, hydrocortisone and methimazole - Patient will need close endocrinology follow-up. We tried to contact patient's primary endocrinologist on phone today. - repeat a.m. labs  Positive troponin - In the setting of tachycardia and demand ischemia. Cardiology consulted. No chest pain. 2-D echo.  Chronic systolic heart failure/NICM - Currently compensated. Follow repeat echo. Continue propranolol for now in place of Coreg - Cardiology evaluation - Strict input and output. Daily weights. ACE inhibitor on hold for now. Lasix on hold as well as  Acute respiratory failure with hypoxia - Probably  secondary to tachycardia. Continue oxygen supplementation and wean as able   Hypertension -Currently controlled. Continue propranolol for now. Ace inhibitors and Lasix on hold. Monitor blood pressure  History of nonsustained ventricular tachycardia -Monitor potassium and magnesium and replace as needed   DVT prophylaxis: Lovenox  Code Status:  Full  Family Communication: None at bedside  Disposition Plan:Home in 1-3 days  Consultants: Cardiology  Procedures: None  Antimicrobials: None    Subjective: Patient seen and examined at bedside. Patient denies any chest pain, fever or vomiting. Feels better but still short of breath.  Objective: Vitals:   02/17/17 0400 02/17/17 0431 02/17/17 0734 02/17/17 0914  BP:  117/82 120/87 120/87  Pulse: 83 86 79 84  Resp: 18  17   Temp:   97.6 F (36.4 C)   TempSrc:   Oral   SpO2: 96%  96%   Weight:      Height:        Intake/Output Summary (Last 24 hours) at 02/17/2017 0939 Last data filed at 02/17/2017 0900 Gross per 24 hour  Intake 260 ml  Output -  Net 260 ml   Filed Weights   02/16/17 1549 02/17/17 0321  Weight: 91.7 kg (202 lb 2.6 oz) 93.2 kg (205 lb 7.5 oz)    Examination:  General exam: Appears calm and comfortable  Respiratory system: Bilateral decreased breath sound at bases With scattered crackles  Cardiovascular system: S1 & S2 heard,tachycardic   Gastrointestinal system: Abdomen is nondistended, soft and nontender. Normal bowel sounds heard. Central nervous system: Alert and oriented. No focal neurological deficits. Moving extremities Extremities: No cyanosis, clubbing, edema  Skin: No rashes, lesions or ulcers Psychiatry: Judgement and insight appear normal. Mood & affect appropriate.  Data Reviewed: I have personally reviewed following labs and imaging studies  CBC: Recent Labs  Lab 02/16/17 0800 02/17/17 0524  WBC 6.0 6.6  HGB 14.8 15.0  HCT 44.4 45.7  MCV 83.6 83.7  PLT 271 258   Basic  Metabolic Panel: Recent Labs  Lab 02/16/17 0800 02/16/17 1153 02/16/17 1621 02/16/17 2002 02/16/17 2301 02/17/17 0524  NA 140  --  138 135 135 138  K 3.5  --  3.5 3.4* 3.8 4.3  CL 109  --  108 106 106 107  CO2 21*  --  19* 19* 17* 21*  GLUCOSE 128*  --  131* 171* 124* 127*  BUN 18  --  20 24* 27* 31*  CREATININE 0.67  --  0.69 0.81 0.79 0.85  CALCIUM 9.2  --  9.2 9.0 8.9 9.2  MG  --  1.8  --   --   --   --   PHOS  --  3.1  --   --   --   --    GFR: Estimated Creatinine Clearance: 60.8 mL/min (by C-G formula based on SCr of 0.85 mg/dL). Liver Function Tests: Recent Labs  Lab 02/16/17 0800 02/17/17 0524  AST 22 17  ALT 9* 8*  ALKPHOS 93 82  BILITOT 1.5* 1.3*  PROT 8.0 7.6  ALBUMIN 3.7 3.5   No results for input(s): LIPASE, AMYLASE in the last 168 hours. No results for input(s): AMMONIA in the last 168 hours. Coagulation Profile: No results for input(s): INR, PROTIME in the last 168 hours. Cardiac Enzymes: Recent Labs  Lab 02/16/17 1153 02/16/17 1621 02/16/17 2255  TROPONINI 0.28* 0.25* 0.13*   BNP (last 3 results) No results for input(s): PROBNP in the last 8760 hours. HbA1C: No results for input(s): HGBA1C in the last 72 hours. CBG: Recent Labs  Lab 02/16/17 1627  GLUCAP 133*   Lipid Profile: No results for input(s): CHOL, HDL, LDLCALC, TRIG, CHOLHDL, LDLDIRECT in the last 72 hours. Thyroid Function Tests: Recent Labs    02/16/17 0800  TSH 0.002*  FREET4 3.48*   Anemia Panel: No results for input(s): VITAMINB12, FOLATE, FERRITIN, TIBC, IRON, RETICCTPCT in the last 72 hours. Sepsis Labs: No results for input(s): PROCALCITON, LATICACIDVEN in the last 168 hours.  Recent Results (from the past 240 hour(s))  MRSA PCR Screening     Status: None   Collection Time: 02/16/17  4:06 PM  Result Value Ref Range Status   MRSA by PCR NEGATIVE NEGATIVE Final    Comment:        The GeneXpert MRSA Assay (FDA approved for NASAL specimens only), is one  component of a comprehensive MRSA colonization surveillance program. It is not intended to diagnose MRSA infection nor to guide or monitor treatment for MRSA infections.          Radiology Studies: Dg Chest 2 View  Result Date: 02/16/2017 CLINICAL DATA:  76 year old female with history of shortness breath since this morning. EXAM: CHEST  2 VIEW COMPARISON:  Chest x-ray 04/13/2014. FINDINGS: Lung volumes are normal. No consolidative airspace disease. No pleural effusions. No pneumothorax. No pulmonary nodule or mass noted. Pulmonary vasculature and the cardiomediastinal silhouette are within normal limits. Atherosclerosis in the thoracic aorta. IMPRESSION: 1.  No radiographic evidence of acute cardiopulmonary disease. 2. Aortic atherosclerosis. Electronically Signed   By: Trudie Reed M.D.   On: 02/16/2017 07:48        Scheduled Meds: . aspirin  81 mg Oral Daily  . enoxaparin (  LOVENOX) injection  40 mg Subcutaneous Q24H  . hydrocortisone sod succinate (SOLU-CORTEF) inj  100 mg Intravenous Q8H  . Iodine Strong (Lugols)  0.2 mL Oral TID  . magnesium oxide  400 mg Oral BID  . methimazole  20 mg Oral TID  . propranolol  60 mg Oral Q6H  . sodium chloride flush  3 mL Intravenous Q12H   Continuous Infusions:   LOS: 1 day        Glade Lloyd, MD [facility] Pager [phone]  If 7PM-7AM, please contact night-coverage [email or web address] Password [facility] 02/17/2017, 9:39 AM

## 2017-02-17 NOTE — Progress Notes (Signed)
Visited with patient and explained Advanced Directive paperwork with her.  Patient is alert and understands what this paperwork is about.  She says she has eight children and eighteen grandchildren and nine great grandchildren.  Patient wants to name one of her daughters as her HCPOA.  Her daughter will be coming today and they will complete paperwork.  Please page or place another consult for Chaplain to come and complete paperwork once completed.  We will be happy to come and assist.    02/17/17 0915  Clinical Encounter Type  Visited With Patient  Visit Type Initial;Spiritual support  Spiritual Encounters  Spiritual Needs Literature    Beryl Meager. Chaplain

## 2017-02-17 NOTE — Consult Note (Signed)
Cardiology Consultation:   Patient ID: Leslie Kline; [id number]; [birth year]   Admit date: 02/16/2017 Date of Consult: 02/17/2017  Primary Care Provider: Carlus Pavlov, MD Primary Cardiologist: Dr. Anne Fu Primary Electrophysiologist: NA  Patient Profile:   Leslie Kline is a 76 y.o. female with a hx of NICM (Echo 06/2014-EF 35-40%, improved from 03/2014 EF 20-25%), chronic systolic heart failure, HTN, and Grave's disease with previous RAI treatment in 2016 and uncontrolled hyperthyroidism on this admission who is being seen today for the evaluation of elevated troponin at the request of Dr. Hanley Ben.   History of Present Illness:   Leslie Kline is a pleasant 76yo female followed by Dr. Anne Fu for NICM and chronic systolic heart failure, first diagnosed in 03/2014 following a week long hospital stay for worsening dyspnea and severely depressed TSH. At the time of this admission, the pt's EF was noted to be 20-25% with mild to moderate MR per echocardiogram as outlined below. Her heart failure was thought to be related to her uncontrolled hyperthyroidism. She additionally underwent a cardiac cath during this time which revealed normal coronary arteries. Given her severely decreased EF and occasional nonsustained VT noted on telemetry on this admission, she was thought to be high risk for out of the hospital primary cardiac event and was placed on LifeVest.   After stabilization, she was discharged from the hospital and followed as an outpatient. A subsequent scheduled echo in 06/2014 showed a slight improvement with an EF of 35-40%. During this time, she was medically managed with a BB, ACE, and lasix therapy. She has remained asymptomatic since this time. Prior to her hospitalization in 2016, she had not been seen by a medical provider in over 6 years.   She has a complicated past of Graves disease with previous RAI treatment in 2016 for a large thyroid nodule in which she sees Dr. Elvera Lennox for medication  and treatment management. It appears that her TSH/T4 have been difficult to control over time.   Three months ago, Leslie Kline "felt better" and decided to stop all of her medications, including her cardiac and thyroid therapies.   She was in her normal state of health until Sunday morning when became moderately SOB in walking around her house, which is out of the ordinary for her. Her symptoms were not present at rest and did not wake her from sleep. She denies chest pain, dizziness, syncope, back pain, palpitations, numbness or tingling in her arms or hands, recent illness, or nausea and vomiting. She denies increased swelling in her lower extremities and reports no change in her appetite and sleep. She denies alleviating or exacerbating factors. She is pain free at this time.   An echo was completed today 02/17/17 which shows a mild decrease in function with an EF now of 30-35%. I have included the full report below. Her troponins have trended down, peaking at 0.28 and are now at 0.13 as of this am. She remains on propanolol. Pt remains asymptomatic.     Past Medical History:  Diagnosis Date  . Anasarca    related to combination of heart failure and uncontrolled hyperthyroidism  . Anemia   . Chronic systolic heart failure (HCC)    a. dx in 2016 thought due to uncontrolled hyperthyroidism, EF 20-25% -> cath with normal coronaries. b. F/u echo 06/2016 EF 35-40%.  . Goiter    R sided, s/p L side thyroidectomy. R side goiter likely need biopsy in the future  . Hypertension   .  Hyperthyroidism   . Hypokalemia   . Nonischemic cardiomyopathy (HCC)    EF 20-25% Jan 2016, cath 04/18/2014 normal coronaries (likely result of uncontrolled hyperthyroidism)  . Nonsustained ventricular tachycardia (HCC)    a. 2016 - no persistent v-tach, asymptomatic, likely related to LV dysfunction, placed on Lifevest; subsequent recovery of EF.  . Obesity   . Prolonged Q-T interval on ECG     Past Surgical History:    Procedure Laterality Date  . ABDOMINAL HYSTERECTOMY    . goiter    . LEFT AND RIGHT HEART CATHETERIZATION WITH CORONARY ANGIOGRAM N/A 04/18/2014   Procedure: LEFT AND RIGHT HEART CATHETERIZATION WITH CORONARY ANGIOGRAM;  Surgeon: Peter M Swaziland, MD;  Location: [facility] CATH LAB;  Service: Cardiovascular;  Laterality: N/A;  . TONSILLECTOMY    . TUBAL LIGATION       Prior to Admission medications   Medication Sig Start Date End Date Taking? Authorizing Provider  aspirin EC 81 MG EC tablet Take 1 tablet (81 mg total) by mouth daily. 04/21/14  Yes Meng, Wynema Birch, PA  carvedilol (COREG) 12.5 MG tablet TAKE 1 TABLET BY MOUTH TWICE DAILY WITH A MEAL 01/16/16  Yes Jake Bathe, MD  carvedilol (COREG) 12.5 MG tablet TAKE 1 TABLET BY MOUTH TWICE DAILY WITH A MEAL 01/16/16  Yes Jake Bathe, MD  furosemide (LASIX) 80 MG tablet TAKE 1 TABLET BY MOUTH EVERY DAY 02/15/15  Yes Jake Bathe, MD  furosemide (LASIX) 80 MG tablet Take 1 tablet (80 mg total) by mouth daily. Patient overdue for an appointment. Must call and schedule for further refills 3rd/final attempt 12/12/16  Yes Skains, Veverly Fells, MD  lisinopril (PRINIVIL,ZESTRIL) 10 MG tablet Take 1 tablet (10 mg total) by mouth daily. Over due for follow up  For future refills needs an appt 11/28/16  Yes Jake Bathe, MD  MAGNESIUM-OXIDE 400 (241.3 Mg) MG tablet TAKE 1 TABLET(400 MG) BY MOUTH DAILY 03/14/16  Yes Carlus Pavlov, MD  methimazole (TAPAZOLE) 5 MG tablet Take 1 tablet (5 mg total) by mouth daily. Please call to schedule appointment 2nd attempt. [phone]) 11/29/16  Yes Jake Bathe, MD  potassium chloride SA (K-DUR,KLOR-CON) 20 MEQ tablet TAKE 2 TABLETS BY MOUTH DAILY 12/19/15  Yes Jake Bathe, MD    Inpatient Medications: Scheduled Meds: . aspirin  81 mg Oral Daily  . enoxaparin (LOVENOX) injection  40 mg Subcutaneous Q24H  . hydrocortisone sod succinate (SOLU-CORTEF) inj  100 mg Intravenous Q8H  . Iodine Strong (Lugols)  0.2 mL Oral TID   . magnesium oxide  400 mg Oral BID  . methimazole  20 mg Oral TID  . propranolol  60 mg Oral Q6H  . sodium chloride flush  3 mL Intravenous Q12H   Continuous Infusions:  PRN Meds: acetaminophen **OR** acetaminophen, hydrALAZINE  Allergies:   No Known Allergies  Social History:   Social History   Socioeconomic History  . Marital status: Widowed    Spouse name: Not on file  . Number of children: Not on file  . Years of education: Not on file  . Highest education level: Not on file  Social Needs  . Financial resource strain: Not on file  . Food insecurity - worry: Not on file  . Food insecurity - inability: Not on file  . Transportation needs - medical: Not on file  . Transportation needs - non-medical: Not on file  Occupational History  . Not on file  Tobacco Use  . Smoking status: Never  Smoker  . Smokeless tobacco: Never Used  Substance and Sexual Activity  . Alcohol use: No  . Drug use: No  . Sexual activity: Not on file  Other Topics Concern  . Not on file  Social History Narrative  . Not on file    Family History:   Family History  Problem Relation Age of Onset  . Heart disease Mother   . Diabetes Mother   . Diabetes Maternal Grandfather    Family Status:  Family Status  Relation Name Status  . Mother  Alive  . Father  Deceased  . MGM  Deceased  . MGF  Deceased  . PGM  Deceased  . PGF  Deceased    ROS:  Please see the history of present illness.  All other ROS reviewed and negative.     Physical Exam/Data:   Vitals:   02/17/17 0431 02/17/17 0734 02/17/17 0914 02/17/17 1148  BP: 117/82 120/87 120/87 116/84  Pulse: 86 79 84 76  Resp:  17  (!) 26  Temp:  97.6 F (36.4 C)  97.7 F (36.5 C)  TempSrc:  Oral  Oral  SpO2:  96%  97%  Weight:      Height:        Intake/Output Summary (Last 24 hours) at 02/17/2017 1300 Last data filed at 02/17/2017 0900 Gross per 24 hour  Intake 260 ml  Output -  Net 260 ml   Filed Weights   02/16/17  1549 02/17/17 0321  Weight: 202 lb 2.6 oz (91.7 kg) 205 lb 7.5 oz (93.2 kg)   Body mass index is 37.58 kg/m.   General: Well developed, well nourished, NAD Skin: Warm, dry, intact  Head: Normocephalic, atraumatic, sclera non-icteric, clear, moist mucus membranes. Positive right neck mass.  Neck: Negative for carotid bruits. No JVD Lungs:Clear to ausculation bilaterally. No wheezes, rales, or rhonchi. Breathing is unlabored. Full sentence communication.  Cardiovascular: RRR with S1 S2. No murmurs, rubs, gallops, or LV heave appreciated. Abdomen: Soft, non-tender, non-distended with normoactive bowel sounds. No rebound/guarding. No obvious abdominal masses. MSK: Strength and tone appear normal for age. 5/5 in all extremities Extremities: No edema. No clubbing or cyanosis. DP/PT pulses 2+ bilaterally Neuro: Alert and oriented. No focal deficits. No facial asymmetry. MAE spontaneously. Psych: Responds to questions appropriately with normal affect.     EKG:  The EKG was personally reviewed and demonstrates: NSR appearance suspicious for inferior infarct with prolonged QT intervals   Telemetry:  Telemetry was personally reviewed and demonstrates: ST improving to SR  Relevant CV Studies:  ECHO:  Echo 03/2014: - Left ventricle: The cavity size was moderately dilated. Wall thickness was normal. Systolic function was severely reduced. The estimated ejection fraction was in the range of 20% to 25%. - Aortic valve: There was trivial regurgitation. - Mitral valve: There was mild to moderate regurgitation. - Left atrium: The atrium was severely dilated. - Right ventricle: The cavity size was moderately dilated. Systolic function was moderately to severely reduced. - Right atrium: The atrium was moderately dilated. - Tricuspid valve: There was moderate regurgitation. - Pulmonic valve: There was moderate regurgitation. - Pulmonary arteries: PA peak pressure: 54 mm Hg (S). - Pericardium,  extracardiac: A trivial pericardial effusion was identified.  Echo 06/2014: - Left ventricle: The cavity size was normal. Systolic function was   moderately reduced. The estimated ejection fraction was in the   range of 35% to 40%. - Left atrium: The atrium was mildly to moderately dilated. - Right  ventricle: The cavity size was mildly dilated. Systolic   function was mildly reduced.  CATH 04/18/2014: Final Conclusions:   1. Normal coronary anatomy 2. Moderate pulmonary HTN with elevated left ventricular filling pressures.  Laboratory Data:  Chemistry Recent Labs  Lab 02/16/17 2002 02/16/17 2301 02/17/17 0524  NA 135 135 138  K 3.4* 3.8 4.3  CL 106 106 107  CO2 19* 17* 21*  GLUCOSE 171* 124* 127*  BUN 24* 27* 31*  CREATININE 0.81 0.79 0.85  CALCIUM 9.0 8.9 9.2  GFRNONAA >60 >60 >60  GFRAA >60 >60 >60  ANIONGAP 10 12 10     Total Protein  Date Value Ref Range Status  02/17/2017 7.6 6.5 - 8.1 g/dL Final   Albumin  Date Value Ref Range Status  02/17/2017 3.5 3.5 - 5.0 g/dL Final   AST  Date Value Ref Range Status  02/17/2017 17 15 - 41 U/L Final   ALT  Date Value Ref Range Status  02/17/2017 8 (L) 14 - 54 U/L Final   Alkaline Phosphatase  Date Value Ref Range Status  02/17/2017 82 38 - 126 U/L Final   Total Bilirubin  Date Value Ref Range Status  02/17/2017 1.3 (H) 0.3 - 1.2 mg/dL Final   Hematology Recent Labs  Lab 02/16/17 0800 02/17/17 0524  WBC 6.0 6.6  RBC 5.31* 5.46*  HGB 14.8 15.0  HCT 44.4 45.7  MCV 83.6 83.7  MCH 27.9 27.5  MCHC 33.3 32.8  RDW 13.2 13.4  PLT 271 258   Cardiac Enzymes Recent Labs  Lab 02/16/17 1153 02/16/17 1621 02/16/17 2255  TROPONINI 0.28* 0.25* 0.13*    Recent Labs  Lab 02/16/17 0809  TROPIPOC 0.27*    BNP Recent Labs  Lab 02/16/17 0800  BNP 381.7*    DDimer No results for input(s): DDIMER in the last 168 hours. TSH:  Lab Results  Component Value Date   TSH 0.002 (L) 02/16/2017   Lipids:No  results found for: CHOL, HDL, LDLCALC, LDLDIRECT, TRIG, CHOLHDL HgbA1c:No results found for: HGBA1C  Radiology/Studies:  Dg Chest 2 View  Result Date: 02/16/2017 CLINICAL DATA:  76 year old female with history of shortness breath since this morning. EXAM: CHEST  2 VIEW COMPARISON:  Chest x-ray 04/13/2014. FINDINGS: Lung volumes are normal. No consolidative airspace disease. No pleural effusions. No pneumothorax. No pulmonary nodule or mass noted. Pulmonary vasculature and the cardiomediastinal silhouette are within normal limits. Atherosclerosis in the thoracic aorta. IMPRESSION: 1.  No radiographic evidence of acute cardiopulmonary disease. 2. Aortic atherosclerosis. Electronically Signed   By: Trudie Reed M.D.   On: 02/16/2017 07:48    Assessment and Plan:   1. Elevated troponin-  -CE's trending down. Last 0.13  -Will continue propanolol -Repeat EKG -Suspect demand process from hyperthyroid, particularly given normal cath 2016  2. Chronic systolic heart failure-  -Echo complete- EF slightly decreased at 30-35% -Severely elevated PA pressures- -Consider adding ACE/ARB once renal function improved -Continue BB  3. HTN -Improved with propanolol, currently 120's/ 80's  4. AKI -Lasix on hold for now. -Pt has no s/s of volume overload -+410 since admission  5. Hyperthyroidism -Per internal medicine -Continue current medication regimen, re-establish medical therapy    Principal Problem:   Hyperthyroidism with storm Active Problems:   Chronic systolic heart failure/NICM   Hypertension   History of Nonsustained ventricular tachycardia    Elevated troponin   Acute respiratory failure with hypoxia (HCC)   Abnormal urinalysis   For questions or updates, please contact  [facility] Please consult [email or web address] for contact info under Cardiology/STEMI.   Signed, Georgie ChardJill McDaniel, NP  02/17/2017 1:00 PM   I have seen and examined the patient along with Georgie ChardJill  McDaniel, NP .  I have reviewed the chart, notes and new data.  I agree with PA/NP's note.  Key new complaints: feeling better, denies ever experiencing chest pain or pressure. Also denies snoring. She knows what sleep apnea is and does not think she has that.  Denies cough and hemoptysis. Key examination changes:  Large unilateral goiter, prominent exophtalmos. RRR, S2 not particularly loud Key new findings / data: echo w severe pulmonary HTN, biventricular dysfunction, biatrial enlargement. ECG with Q waves III and aVF (but not lead II) and subtle inferior ST elevation, markedly prolonged QT.  PLAN: The LV dysfunction is readily attributable to thyrotoxicosis, but the pulm HTN is harder to explain. Unlikely to be entirely related to L heart failure, non smoker, no daytime hypersomnolence. Once she is euthyroid would consider right heart catheterization for accurate measurement. Also recommend V/Q scan to exclude recurrent venous thromboembolic disease (avoid iodinated contrast based procedures such as CTA until she is euthyroid). Agree with propranolol. Renal parameters are better, we can restart ACEi. Low suspicion for true acute coronary event based on symptoms and echo findings. ECG abnormalities are harder to explain. Avoid QT prolonging meds.  Thurmon FairMihai Kalvyn Desa, MD, [facility] [phone] 02/17/2017, 4:27 PM

## 2017-02-17 NOTE — Progress Notes (Signed)
  Echocardiogram 2D Echocardiogram has been performed.  Leslie Kline T Maven Rosander 02/17/2017, 1:09 PM

## 2017-02-18 ENCOUNTER — Inpatient Hospital Stay (HOSPITAL_COMMUNITY): Payer: Medicare Other

## 2017-02-18 DIAGNOSIS — E0520 Thyrotoxicosis with toxic multinodular goiter without thyrotoxic crisis or storm: Secondary | ICD-10-CM | POA: Diagnosis not present

## 2017-02-18 DIAGNOSIS — E0521 Thyrotoxicosis with toxic multinodular goiter with thyrotoxic crisis or storm: Secondary | ICD-10-CM

## 2017-02-18 DIAGNOSIS — I10 Essential (primary) hypertension: Secondary | ICD-10-CM | POA: Diagnosis not present

## 2017-02-18 DIAGNOSIS — R748 Abnormal levels of other serum enzymes: Secondary | ICD-10-CM | POA: Diagnosis not present

## 2017-02-18 DIAGNOSIS — I5022 Chronic systolic (congestive) heart failure: Secondary | ICD-10-CM | POA: Diagnosis not present

## 2017-02-18 DIAGNOSIS — J9601 Acute respiratory failure with hypoxia: Secondary | ICD-10-CM | POA: Diagnosis not present

## 2017-02-18 LAB — COMPREHENSIVE METABOLIC PANEL
ALBUMIN: 3.4 g/dL — AB (ref 3.5–5.0)
ALT: 9 U/L — ABNORMAL LOW (ref 14–54)
ANION GAP: 11 (ref 5–15)
AST: 20 U/L (ref 15–41)
Alkaline Phosphatase: 78 U/L (ref 38–126)
BILIRUBIN TOTAL: 0.8 mg/dL (ref 0.3–1.2)
BUN: 43 mg/dL — AB (ref 6–20)
CALCIUM: 9 mg/dL (ref 8.9–10.3)
CO2: 21 mmol/L — ABNORMAL LOW (ref 22–32)
CREATININE: 0.88 mg/dL (ref 0.44–1.00)
Chloride: 107 mmol/L (ref 101–111)
GFR calc Af Amer: >60 mL/min (ref >60)
GFR calc non Af Amer: >60 mL/min (ref >60)
GLUCOSE: 148 mg/dL — AB (ref 65–99)
POTASSIUM: 4.2 mmol/L (ref 3.5–5.1)
SODIUM: 139 mmol/L (ref 135–145)
TOTAL PROTEIN: 7.1 g/dL (ref 6.5–8.1)

## 2017-02-18 LAB — T3: T3, Total: 192 ng/dL — ABNORMAL HIGH (ref 71–180)

## 2017-02-18 LAB — CBC WITH DIFFERENTIAL/PLATELET
BASOS PCT: 0 %
Basophils Absolute: 0 10*3/uL (ref 0.0–0.1)
EOS ABS: 0 10*3/uL (ref 0.0–0.7)
EOS PCT: 0 %
HCT: 44.7 % (ref 36.0–46.0)
Hemoglobin: 14.3 g/dL (ref 12.0–15.0)
Lymphocytes Relative: 14 %
Lymphs Abs: 0.9 10*3/uL (ref 0.7–4.0)
MCH: 27.4 pg (ref 26.0–34.0)
MCHC: 32 g/dL (ref 30.0–36.0)
MCV: 85.8 fL (ref 78.0–100.0)
MONO ABS: 0.2 10*3/uL (ref 0.1–1.0)
MONOS PCT: 4 %
Neutro Abs: 5.1 10*3/uL (ref 1.7–7.7)
Neutrophils Relative %: 82 %
Platelets: 232 10*3/uL (ref 150–400)
RBC: 5.21 MIL/uL — ABNORMAL HIGH (ref 3.87–5.11)
RDW: 13.7 % (ref 11.5–15.5)
WBC: 6.2 10*3/uL (ref 4.0–10.5)

## 2017-02-18 LAB — URINE CULTURE

## 2017-02-18 LAB — THYROID ANTIBODIES
Thyroglobulin Antibody: <1 IU/mL (ref 0.0–0.9)
Thyroperoxidase Ab SerPl-aCnc: 14 IU/mL (ref 0–34)

## 2017-02-18 LAB — MAGNESIUM: MAGNESIUM: 2.5 mg/dL — AB (ref 1.7–2.4)

## 2017-02-18 MED ORDER — METHIMAZOLE 10 MG PO TABS
10.0000 mg | ORAL_TABLET | Freq: Every day | ORAL | Status: DC
  Administered 2017-02-18: 10 mg via ORAL
  Filled 2017-02-18 (×2): qty 1

## 2017-02-18 MED ORDER — LISINOPRIL 10 MG PO TABS
10.0000 mg | ORAL_TABLET | Freq: Every day | ORAL | Status: DC
  Administered 2017-02-18: 10 mg via ORAL
  Filled 2017-02-18: qty 1

## 2017-02-18 MED ORDER — TECHNETIUM TO 99M ALBUMIN AGGREGATED
4.0000 | Freq: Once | INTRAVENOUS | Status: AC | PRN
  Administered 2017-02-18: 4 via INTRAVENOUS

## 2017-02-18 MED ORDER — METHIMAZOLE 5 MG PO TABS
5.0000 mg | ORAL_TABLET | Freq: Every evening | ORAL | Status: DC
  Administered 2017-02-18: 5 mg via ORAL
  Filled 2017-02-18: qty 1

## 2017-02-18 MED ORDER — METHIMAZOLE 5 MG PO TABS: ORAL_TABLET | ORAL | 0 refills | Status: AC

## 2017-02-18 MED ORDER — CARVEDILOL 25 MG PO TABS: 25.0000 mg | ORAL_TABLET | Freq: Two times a day (BID) | ORAL | 0 refills | Status: AC

## 2017-02-18 MED ORDER — HYDROCORTISONE NA SUCCINATE PF 100 MG IJ SOLR
50.0000 mg | Freq: Three times a day (TID) | INTRAMUSCULAR | Status: DC
  Administered 2017-02-18: 50 mg via INTRAVENOUS
  Filled 2017-02-18: qty 2

## 2017-02-18 MED ORDER — TECHNETIUM TC 99M DIETHYLENETRIAME-PENTAACETIC ACID
30.0000 | Freq: Once | INTRAVENOUS | Status: AC | PRN
  Administered 2017-02-18: 30 via RESPIRATORY_TRACT

## 2017-02-18 NOTE — Care Management Obs Status (Signed)
MEDICARE OBSERVATION STATUS NOTIFICATION   Patient Details  Name: Leslie Kline MRN: [id number] Date of Birth: [birth year]   Medicare Observation Status Notification Given:  Yes    Lawerance Sabal, RN 02/18/2017, 1:16 PM

## 2017-02-18 NOTE — Progress Notes (Signed)
PT Cancellation Note  Patient Details Name: Leslie Kline MRN: [id number] DOB: 05-11-1940   Cancelled Treatment:    Reason Eval/Treat Not Completed: Patient at procedure or test/unavailable(Pt off unit for VQ scan at this time, will await return and results of scan)   Jiraiya Mcewan Artis Delay 02/18/2017, 10:44 AM  Joycelyn Rua, PTA pager [phone]

## 2017-02-18 NOTE — Discharge Summary (Signed)
Physician Discharge Summary  Leslie Kline ZOX:[id number] DOB: 03-18-1941 DOA: 02/16/2017  PCP: Carlus Pavlov, MD  Admit date: 02/16/2017 Discharge date: 02/18/2017  Admitted From: Home Disposition:  Home  Recommendations for Outpatient Follow-up:  1. Follow up with PCP in 1 week 2. Follow-up with cardiology in 1-2 weeks 3. Patient will benefit from outpatient evaluation by pulmonary.  Pulmonary referral made 4. Comply with medications and follow-up  Home Health: No Equipment/Devices: None  Discharge Condition: Stable CODE STATUS: Full Diet recommendation: Heart Healthy   Brief/Interim Summary: 76 year old female with history of Graves' disease with previous RAI treatment in 2016 for very large right thyroid nodule, noncompliant with her medications including methimazole, nonischemic cardiomyopathy with last ejection fraction of 35-40% in echo in 2016 presented on 02/16/2017 with shortness of breath. She was found to be tachycardic with elevated free T4 at 3.48 and almost undetectable TSH at 0.002; she also had mildly positive troponin. ED provider spoke to on call cardiology who suggested that elevated troponin was secondary to demand ischemia in the setting of thyrotoxicosis. Patient was admitted for impending thyroid storm.  Cardiology also evaluated the patient and recommended VQ scan for abnormal echo findings.  VQ scan showed intermediate probability for pulmonary embolism.  Discussed with cardiology who recommends referral to pulmonary as an outpatient for the same.  Symptoms have much improved and patient will need outpatient follow-up with endocrinology at earliest convenience.  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 - Currently on propranolol, lugol's iodine, hydrocortisone and methimazole -Heart rate much improved.  Oxygen requirement has improved.  Patient feels better and wants to go home. -Spoke to Dr. Wynona Canes Gherghe/endocrinologist on phone on 02/17/2017 and she recommended that patient be discharged on methimazole 10 mg in the morning and 5 mg in the evening until the patient sees Dr. Elvera Lennox in her office.  Patient has to call Dr. Charlean Sanfilippo office at an earliest convenience to make an appointment.  Comply with medications and follow-up -Change Coreg to 25 mg p.o. twice daily on discharge  Positive troponin - In the setting of tachycardia and demand ischemia. Cardiology following.  No chest pain 2-D echo showed EF of 30-35% with mild to moderate aortic regurgitation and moderate tricuspid and pulmonary regurgitation with moderate pulmonary hypertension.  VQ scan done subsequently showed intermediate probability for pulmonary embolism.  Spoke to Dr. Royann Shivers from cardiology who recommends that patient should not be started on anticoagulation at this moment without definitive diagnosis.  He recommends outpatient referral to pulmonary.  We would not proceed with CT angiogram at this moment as contrast might precipitate thyroid storm; would wait for patient to be euthyroid. -Outpatient follow-up with cardiology  Intermediate probability for pulmonary embolism -Plan as above  Chronic systolic heart failure/NICM - Currently compensated.  Echo as above.  Continue Coreg - Cardiology follow-up as an outpatient - Resume ACE inhibitor and Lasix  Acute respiratory failure with hypoxia - Probably secondary to tachycardia.  -Resolved.  Currently on room air.  Outpatient follow-up with pulmonary  Hypertension -Currently controlled.  Continue Coreg but at 25 mg twice daily  along with lisinopril and Lasix  History of nonsustained ventricular tachycardia -Outpatient follow-up  Discharge Instructions  Discharge Instructions    Ambulatory referral to Cardiology   Complete by:  As directed    Follow up in 1-2weeks   Ambulatory referral to Pulmonology   Complete by:  As directed    Intermediate V/Q scan in a patient with recent thyroid storm   Call MD for:  difficulty breathing, headache or visual disturbances   Complete by:  As directed    Call MD for:  extreme fatigue   Complete by:  As directed    Call MD for:  hives   Complete by:  As directed    Call MD for:  persistant dizziness or light-headedness   Complete by:  As directed    Call MD for:  persistant nausea and vomiting   Complete by:  As directed    Call MD for:  severe uncontrolled pain   Complete by:  As directed    Call MD for:  temperature >100.4   Complete by:  As directed    Diet - low sodium heart healthy   Complete by:  As directed    Increase activity slowly   Complete by:  As directed      Allergies as of 02/18/2017   No Known Allergies     Medication List    TAKE these medications   aspirin 81 MG EC tablet Take 1 tablet (81 mg total) by mouth daily.   carvedilol 25 MG tablet Commonly known as:  COREG Take 1 tablet (25 mg total) by mouth 2 (two) times daily. What changed:    medication strength  See the new instructions.  Another medication with the same name was removed. Continue taking this medication, and follow the directions you see here.   furosemide 80 MG tablet Commonly known as:  LASIX TAKE 1 TABLET BY MOUTH EVERY DAY What changed:  Another medication with the same name was removed. Continue taking this medication, and follow the directions you see here.   lisinopril 10 MG tablet Commonly known as:  PRINIVIL,ZESTRIL Take 1 tablet (10 mg total) by mouth daily. Over due for follow up  For future refills needs an appt   MAGNESIUM-OXIDE 400 (241.3  Mg) MG tablet Generic drug:  magnesium oxide TAKE 1 TABLET(400 MG) BY MOUTH DAILY   methimazole 5 MG tablet Commonly known as:  TAPAZOLE 10mg  in AM and 5mg  in PM What changed:    how much to take  how to take this  when to take this  additional instructions   potassium chloride SA 20 MEQ tablet Commonly known as:  K-DUR,KLOR-CON TAKE 2 TABLETS BY MOUTH DAILY      Follow-up Information    Carlus Pavlov, MD. Schedule an appointment as soon as possible for a visit in 1 week(s).   Specialty:  Internal Medicine Contact information: [address] [place] [address] [phone]          No Known Allergies  Consultations:  Cardiology   Procedures/Studies: Dg Chest 2 View  Result Date: 02/18/2017 CLINICAL DATA:  Shortness of breath EXAM: CHEST  2 VIEW COMPARISON:  Chest x-ray of 02/16/2017 FINDINGS: No active infiltrate or effusion is seen. Moderate cardiomegaly is stable. Mediastinal and hilar contours are unremarkable. There is deviation of the upper tracheal air shadow to the left probably due to enlargement of the right lobe of thyroid which appears stable. There are degenerative changes throughout the mid to lower thoracic spine. IMPRESSION: One stable moderate cardiomegaly. No definite active process.  Next item probable enlargement of the right lobe thyroid. Correlate clinically. Electronically Signed   By: Dwyane DeePaul  Barry M.D.   On: 02/18/2017 08:54   Dg Chest 2 View  Result Date: 02/16/2017 CLINICAL DATA:  76 year old female with history of shortness breath since this morning. EXAM: CHEST  2 VIEW COMPARISON:  Chest x-ray 04/13/2014. FINDINGS: Lung volumes are normal. No consolidative airspace disease. No pleural effusions. No pneumothorax. No pulmonary nodule or mass noted. Pulmonary vasculature and the cardiomediastinal silhouette are within normal limits. Atherosclerosis in the thoracic aorta. IMPRESSION: 1.  No radiographic evidence of  acute cardiopulmonary disease. 2. Aortic atherosclerosis. Electronically Signed   By: Trudie Reedaniel  Entrikin M.D.   On: 02/16/2017 07:48   Nm Pulmonary Perf And Vent  Result Date: 02/18/2017 CLINICAL DATA:  Chronic shortness of breath. EXAM: NUCLEAR MEDICINE VENTILATION - PERFUSION LUNG SCAN TECHNIQUE: Ventilation images were obtained in multiple projections using inhaled aerosol Tc-9267m DTPA. Perfusion images were obtained in multiple projections after intravenous injection of Tc-4367m MAA. RADIOPHARMACEUTICALS:  32.2 mCi Technetium-4467m DTPA aerosol inhalation and 4.2 mCi Technetium-5267m MAA IV COMPARISON:  Chest x-ray 02/18/2017. FINDINGS: Bilateral matching ventilation and perfusion defects are present making this an indeterminate scan for pulmonary embolus. Changes are most likely related to the pulmonary disease as ventilation defects are worse than perfusion defects. IMPRESSION: Indeterminate scan for pulmonary embolus. Electronically Signed   By: Maisie Fushomas  Register   On: 02/18/2017 11:17    Echo on 02/17/2017 Study Conclusions  - Left ventricle: The cavity size was normal. There was mild   concentric hypertrophy. Systolic function was moderately to   severely reduced. The estimated ejection fraction was in the   range of 30% to 35%. Hypokinesis of the anterior, anterolateral,   lateral, inferolateral, inferior, and inferoseptal myocardium. - Aortic valve: Transvalvular velocity was within the normal range.   There was no stenosis. There was mild to moderate regurgitation. - Mitral valve: Transvalvular velocity was within the normal range.   There was no evidence for stenosis. There was no regurgitation. - Right ventricle: The cavity size was moderately dilated. Wall   thickness was normal. Systolic function was moderately reduced. - Right atrium: The atrium was severely dilated. - Tricuspid valve: There was moderate regurgitation. - Pulmonic valve: There was moderate regurgitation. - Pulmonary  arteries: Systolic pressure was severely increased. PA   peak pressure: 80 mm Hg (S).   Subjective: Patient seen and examined at bedside.  She denies any current chest pain, shortness of breath, nausea or vomiting.  She wants to go home  Discharge Exam: Vitals:   02/18/17 0425 02/18/17 0820  BP: 137/90 (!) 118/101  Pulse: 72 72  Resp: (!) 22 (!) 23  Temp: 97.7 F (36.5 C) 97.6 F (36.4 C)  SpO2: 96% 93%   Vitals:   02/18/17 0000 02/18/17 0425 02/18/17 0557 02/18/17 0820  BP: 122/88 137/90  (!) 118/101  Pulse: 65 72  72  Resp: (!) 22 (!) 22  (!) 23  Temp: (!) 97.5 F (36.4 C) 97.7 F (36.5 C)  97.6 F (36.4 C)  TempSrc: Oral Oral  Oral  SpO2: 93% 96%  93%  Weight:   90.6 kg (199 lb 11.2 oz)   Height:        General: Pt is alert, awake, not in acute distress Cardiovascular: Rate controlled, S1/S2 + Respiratory: Bilateral decreased breath sounds at bases Abdominal: Soft, NT, ND, bowel sounds + Extremities: no edema, no cyanosis    The results of significant diagnostics  from this hospitalization (including imaging, microbiology, ancillary and laboratory) are listed below for reference.     Microbiology: Recent Results (from the past 240 hour(s))  Urine Culture     Status: Abnormal   Collection Time: 02/16/17  2:26 PM  Result Value Ref Range Status   Specimen Description URINE, CLEAN CATCH  Final   Special Requests NONE  Final   Culture MULTIPLE SPECIES PRESENT, SUGGEST RECOLLECTION (A)  Final   Report Status 02/18/2017 FINAL  Final  MRSA PCR Screening     Status: None   Collection Time: 02/16/17  4:06 PM  Result Value Ref Range Status   MRSA by PCR NEGATIVE NEGATIVE Final    Comment:        The GeneXpert MRSA Assay (FDA approved for NASAL specimens only), is one component of a comprehensive MRSA colonization surveillance program. It is not intended to diagnose MRSA infection nor to guide or monitor treatment for MRSA infections.      Labs: BNP (last  3 results) Recent Labs    02/16/17 0800  BNP 381.7*   Basic Metabolic Panel: Recent Labs  Lab 02/16/17 1153  02/16/17 2002 02/16/17 2301 02/17/17 0524 02/17/17 1358 02/18/17 0255  NA  --    < > 135 135 138 138 139  K  --    < > 3.4* 3.8 4.3 4.1 4.2  CL  --    < > 106 106 107 109 107  CO2  --    < > 19* 17* 21* 21* 21*  GLUCOSE  --    < > 171* 124* 127* 181* 148*  BUN  --    < > 24* 27* 31* 39* 43*  CREATININE  --    < > 0.81 0.79 0.85 1.10* 0.88  CALCIUM  --    < > 9.0 8.9 9.2 9.3 9.0  MG 1.8  --   --   --   --   --  2.5*  PHOS 3.1  --   --   --   --   --   --    < > = values in this interval not displayed.   Liver Function Tests: Recent Labs  Lab 02/16/17 0800 02/17/17 0524 02/18/17 0255  AST 22 17 20   ALT 9* 8* 9*  ALKPHOS 93 82 78  BILITOT 1.5* 1.3* 0.8  PROT 8.0 7.6 7.1  ALBUMIN 3.7 3.5 3.4*   No results for input(s): LIPASE, AMYLASE in the last 168 hours. No results for input(s): AMMONIA in the last 168 hours. CBC: Recent Labs  Lab 02/16/17 0800 02/17/17 0524 02/18/17 0255  WBC 6.0 6.6 6.2  NEUTROABS  --   --  5.1  HGB 14.8 15.0 14.3  HCT 44.4 45.7 44.7  MCV 83.6 83.7 85.8  PLT 271 258 232   Cardiac Enzymes: Recent Labs  Lab 02/16/17 1153 02/16/17 1621 02/16/17 2255  TROPONINI 0.28* 0.25* 0.13*   BNP: Invalid input(s): POCBNP CBG: Recent Labs  Lab 02/16/17 1627  GLUCAP 133*   D-Dimer No results for input(s): DDIMER in the last 72 hours. Hgb A1c No results for input(s): HGBA1C in the last 72 hours. Lipid Profile No results for input(s): CHOL, HDL, LDLCALC, TRIG, CHOLHDL, LDLDIRECT in the last 72 hours. Thyroid function studies Recent Labs    02/16/17 0800  TSH 0.002*   Anemia work up No results for input(s): VITAMINB12, FOLATE, FERRITIN, TIBC, IRON, RETICCTPCT in the last 72 hours. Urinalysis    Component Value Date/Time  COLORURINE AMBER (A) 02/16/2017 1100   APPEARANCEUR CLOUDY (A) 02/16/2017 1100   LABSPEC 1.028  02/16/2017 1100   PHURINE 5.0 02/16/2017 1100   GLUCOSEU NEGATIVE 02/16/2017 1100   HGBUR MODERATE (A) 02/16/2017 1100   BILIRUBINUR NEGATIVE 02/16/2017 1100   KETONESUR 20 (A) 02/16/2017 1100   PROTEINUR 100 (A) 02/16/2017 1100   NITRITE POSITIVE (A) 02/16/2017 1100   LEUKOCYTESUR LARGE (A) 02/16/2017 1100   Sepsis Labs Invalid input(s): PROCALCITONIN,  WBC,  LACTICIDVEN Microbiology Recent Results (from the past 240 hour(s))  Urine Culture     Status: Abnormal   Collection Time: 02/16/17  2:26 PM  Result Value Ref Range Status   Specimen Description URINE, CLEAN CATCH  Final   Special Requests NONE  Final   Culture MULTIPLE SPECIES PRESENT, SUGGEST RECOLLECTION (A)  Final   Report Status 02/18/2017 FINAL  Final  MRSA PCR Screening     Status: None   Collection Time: 02/16/17  4:06 PM  Result Value Ref Range Status   MRSA by PCR NEGATIVE NEGATIVE Final    Comment:        The GeneXpert MRSA Assay (FDA approved for NASAL specimens only), is one component of a comprehensive MRSA colonization surveillance program. It is not intended to diagnose MRSA infection nor to guide or monitor treatment for MRSA infections.      Time coordinating discharge: 35 minutes  SIGNED:   Glade Lloyd, MD  [facility] 02/18/2017, 12:10 PM Pager: [phone]  If 7PM-7AM, please contact night-coverage [email or web address] Password [email or web address]

## 2017-02-18 NOTE — Progress Notes (Signed)
Physical Therapy Treatment Patient Details Name: Leslie Kline MRN: [id number] DOB: April 10, 1940 Today's Date: 02/18/2017    History of Present Illness 76 year old female with history of Graves' disease with previous RAI treatment in 2016 for very large right thyroid nodule, noncompliant with medication, NICM with EF of 35-40% presented on 02/16/2017 with SOB, hyperthyroidism with thyrotoxicosis and pending storm    PT Comments    Pt sitting EOB upon arrival and willing to participate in therapy. Slightly impuslive required vc to wait for SPTA to adjust lines and take BP.BP seated EOB at beginning of session 144/45mmHg.  Min guard to Min A during ambulation for safety. VC to ambulate at a slower pace and for slow pursed lip breathing due to RR 23-43bpm. Educated pt on using SPC once she is at home to help with her balance. Pt BP at end of session seated in recliner 124/66mmHg. Current plan remains appropriate to help increase pt independence and safety with mobility.   Follow Up Recommendations  Home health PT;Supervision/Assistance - 24 hour     Equipment Recommendations       Recommendations for Other Services       Precautions / Restrictions Precautions Precautions: Fall Restrictions Weight Bearing Restrictions: Yes    Mobility  Bed Mobility               General bed mobility comments: Pt sitting EOB upon arrival  Transfers Overall transfer level: Needs assistance Equipment used: None Transfers: Sit to/from Stand Sit to Stand: Min guard         General transfer comment: Pt able to stand from EOB, commode and recliner with min guard for safety. VC for proper hand placement and to slow down when descending into chair, bed and commode.  Ambulation/Gait Ambulation/Gait assistance: Min guard;Min assist Ambulation Distance (Feet): 300 Feet Assistive device: None Gait Pattern/deviations: Drifts right/left;Decreased stride length;Step-through pattern;Trunk flexed Gait  velocity: decreased   General Gait Details: Pt decline using RW. Pt min guard to min A during ambulation for safety. Unsteady gait but no LOB while ambulating in hallway. Pt reaching for bed rails when ambulting in room going to recliner. VC to slow down during ambulation. RR 23-43bpm cues for slow pursed lip breathing.   Stairs            Wheelchair Mobility    Modified Rankin (Stroke Patients Only)       Balance Overall balance assessment: Needs assistance Sitting-balance support: Feet supported;No upper extremity supported Sitting balance-Leahy Scale: Fair Sitting balance - Comments: Pt able to sit EOB without UE support.    Standing balance support: No upper extremity supported;During functional activity Standing balance-Leahy Scale: Fair Standing balance comment: Pt able to stand during self pericare with no LOB min guard for safety                            Cognition Arousal/Alertness: Awake/alert Behavior During Therapy: Impulsive Overall Cognitive Status: Within Functional Limits for tasks assessed                         Following Commands: Follows one step commands consistently Safety/Judgement: Decreased awareness of safety     General Comments: Pt slightly impulsive VC to slow down during ambulation and exercises.       Exercises General Exercises - Lower Extremity Long Arc Quad: AROM;Both;Seated;10 reps Hip Flexion/Marching: AROM;Both;Seated;10 reps Heel Raises: AROM;Both;Seated;10 reps Other Exercises Other Exercises: towel squeeze  10 reps 5 sec holds Other Exercises: sit to stands 10 reps from recliner    General Comments        Pertinent Vitals/Pain Pain Assessment: No/denies pain    Home Living                      Prior Function            PT Goals (current goals can now be found in the care plan section) Acute Rehab PT Goals Patient Stated Goal: not discussed Progress towards PT goals: Progressing  toward goals    Frequency    Min 3X/week      PT Plan Current plan remains appropriate    Co-evaluation              AM-PAC PT "6 Clicks" Daily Activity  Outcome Measure  Difficulty turning over in bed (including adjusting bedclothes, sheets and blankets)?: A Little Difficulty moving from lying on back to sitting on the side of the bed? : A Little Difficulty sitting down on and standing up from a chair with arms (e.g., wheelchair, bedside commode, etc,.)?: A Little Help needed moving to and from a bed to chair (including a wheelchair)?: A Little Help needed walking in hospital room?: A Little Help needed climbing 3-5 steps with a railing? : A Lot 6 Click Score: 17    End of Session Equipment Utilized During Treatment: Gait belt Activity Tolerance: Patient tolerated treatment well Patient left: in chair;with call bell/phone within reach Nurse Communication: Mobility status PT Visit Diagnosis: Unsteadiness on feet (R26.81);Other abnormalities of gait and mobility (R26.89);Muscle weakness (generalized) (M62.81)     Time: [id number] PT Time Calculation (min) (ACUTE ONLY): 30 min  Charges:  $Gait Training: 8-22 mins $Therapeutic Exercise: 8-22 mins                    G Codes:  Functional Assessment Tool Used: AM-PAC 6 Clicks Basic Mobility    Leslie RadonSybil Rhys Kline, SPTA    Leslie Kline 02/18/2017, 3:21 PM

## 2017-02-18 NOTE — Progress Notes (Signed)
Progress Note  Patient Name: Leslie Kline Date of Encounter: 02/18/2017  Primary Cardiologist: Anne Fu  Subjective   She feels well and is a little upset that we are testing so many things.  Inpatient Medications    Scheduled Meds: . aspirin  81 mg Oral Daily  . enoxaparin (LOVENOX) injection  40 mg Subcutaneous Q24H  . hydrocortisone sod succinate (SOLU-CORTEF) inj  50 mg Intravenous Q8H  . Iodine Strong (Lugols)  0.2 mL Oral TID  . magnesium oxide  400 mg Oral BID  . methimazole  10 mg Oral Daily  . methimazole  5 mg Oral QPM  . propranolol  60 mg Oral Q6H  . sodium chloride flush  3 mL Intravenous Q12H   Continuous Infusions:  PRN Meds: acetaminophen **OR** acetaminophen, hydrALAZINE   Vital Signs    Vitals:   02/18/17 0000 02/18/17 0425 02/18/17 0557 02/18/17 0820  BP: 122/88 137/90  (!) 118/101  Pulse: 65 72  72  Resp: (!) 22 (!) 22  (!) 23  Temp: (!) 97.5 F (36.4 C) 97.7 F (36.5 C)  97.6 F (36.4 C)  TempSrc: Oral Oral  Oral  SpO2: 93% 96%  93%  Weight:   199 lb 11.2 oz (90.6 kg)   Height:        Intake/Output Summary (Last 24 hours) at 02/18/2017 1113 Last data filed at 02/18/2017 0900 Gross per 24 hour  Intake 655 ml  Output -  Net 655 ml   Filed Weights   02/16/17 1549 02/17/17 0321 02/18/17 0557  Weight: 202 lb 2.6 oz (91.7 kg) 205 lb 7.5 oz (93.2 kg) 199 lb 11.2 oz (90.6 kg)    Telemetry    Normal sinus rhythm- Personally Reviewed  ECG    No new tracing- Personally Reviewed  Physical Exam  obese GEN: No acute distress.   Neck: No JVD, large unilateral right sided goiter, no bruit Cardiac: RRR, no murmurs, rubs, or gallops.  Respiratory: Clear to auscultation bilaterally. GI: Soft, nontender, non-distended  MS: No edema; No deformity. Neuro:  Nonfocal  Psych: Normal affect   Labs    Chemistry Recent Labs  Lab 02/16/17 0800  02/17/17 0524 02/17/17 1358 02/18/17 0255  NA 140   < > 138 138 139  K 3.5   < > 4.3 4.1 4.2    CL 109   < > 107 109 107  CO2 21*   < > 21* 21* 21*  GLUCOSE 128*   < > 127* 181* 148*  BUN 18   < > 31* 39* 43*  CREATININE 0.67   < > 0.85 1.10* 0.88  CALCIUM 9.2   < > 9.2 9.3 9.0  PROT 8.0  --  7.6  --  7.1  ALBUMIN 3.7  --  3.5  --  3.4*  AST 22  --  17  --  20  ALT 9*  --  8*  --  9*  ALKPHOS 93  --  82  --  78  BILITOT 1.5*  --  1.3*  --  0.8  GFRNONAA >60   < > >60 48* >60  GFRAA >60   < > >60 55* >60  ANIONGAP 10   < > 10 8 11    < > = values in this interval not displayed.     Hematology Recent Labs  Lab 02/16/17 0800 02/17/17 0524 02/18/17 0255  WBC 6.0 6.6 6.2  RBC 5.31* 5.46* 5.21*  HGB 14.8 15.0 14.3  HCT 44.4  45.7 44.7  MCV 83.6 83.7 85.8  MCH 27.9 27.5 27.4  MCHC 33.3 32.8 32.0  RDW 13.2 13.4 13.7  PLT 271 258 232    Cardiac Enzymes Recent Labs  Lab 02/16/17 1153 02/16/17 1621 02/16/17 2255  TROPONINI 0.28* 0.25* 0.13*    Recent Labs  Lab 02/16/17 0809  TROPIPOC 0.27*     BNP Recent Labs  Lab 02/16/17 0800  BNP 381.7*     DDimer No results for input(s): DDIMER in the last 168 hours.   Radiology    Dg Chest 2 View  Result Date: 02/18/2017 CLINICAL DATA:  Shortness of breath EXAM: CHEST  2 VIEW COMPARISON:  Chest x-ray of 02/16/2017 FINDINGS: No active infiltrate or effusion is seen. Moderate cardiomegaly is stable. Mediastinal and hilar contours are unremarkable. There is deviation of the upper tracheal air shadow to the left probably due to enlargement of the right lobe of thyroid which appears stable. There are degenerative changes throughout the mid to lower thoracic spine. IMPRESSION: One stable moderate cardiomegaly. No definite active process. Next item probable enlargement of the right lobe thyroid. Correlate clinically. Electronically Signed   By: Dwyane DeePaul  Barry M.D.   On: 02/18/2017 08:54    Cardiac Studies   CATH 04/18/2014: Final Conclusions:  1. Normal coronary anatomy 2. Moderate pulmonary HTN with elevated left  ventricular filling pressures.  ECHO 02/17/2017 - Left ventricle: The cavity size was normal. There was mild   concentric hypertrophy. Systolic function was moderately to   severely reduced. The estimated ejection fraction was in the   range of 30% to 35%. Hypokinesis of the anterior, anterolateral,   lateral, inferolateral, inferior, and inferoseptal myocardium. - Aortic valve: Transvalvular velocity was within the normal range.   There was no stenosis. There was mild to moderate regurgitation. - Mitral valve: Transvalvular velocity was within the normal range.   There was no evidence for stenosis. There was no regurgitation. - Right ventricle: The cavity size was moderately dilated. Wall   thickness was normal. Systolic function was moderately reduced. - Right atrium: The atrium was severely dilated. - Tricuspid valve: There was moderate regurgitation. - Pulmonic valve: There was moderate regurgitation. - Pulmonary arteries: Systolic pressure was severely increased. PA   peak pressure: 80 mm Hg (S).   Patient Profile     76 y.o. female with recurrent thyrotoxicosis and moderate cardiomyopathy (LVEF around 35%), presumed to be secondary to thyrotoxicosis (normal coronaries by cath 2016)  Assessment & Plan    1.CHF: Hard to assess from status due to obesity, but no overt signs of hypervolemia.  On beta-blockers (propranolol), starting ACEi.  Does not appear that she needs to loop diuretics at this time. 2. PAH: Going down for VQ scan today. 3. Toxic goiter: restarting meds.  When ready for discharge, will arrange follow-up visit in 2 weeks or less.  For questions or updates, please contact [facility] Please consult [email or web address] for contact info under Cardiology/STEMI.      Signed, Thurmon FairMihai Johnay Mano, MD  02/18/2017, 11:13 AM

## 2017-02-18 NOTE — Progress Notes (Signed)
Pt with order for discharge home, pt. Claimed that  Family is available to come and pick her up at 6 pm.

## 2017-02-18 NOTE — Care Management CC44 (Signed)
Condition Code 44 Documentation Completed  Patient Details  Name: Graycin Marcucci MRN: [id number] Date of Birth: [birth year]   Condition Code 44 given:  Yes Patient signature on Condition Code 44 notice:  Yes Documentation of 2 MD's agreement:  Yes Code 44 added to claim:  Yes    Lawerance Sabal, RN 02/18/2017, 1:16 PM

## 2017-02-18 NOTE — Progress Notes (Signed)
Transported to [facility] for VQ scan by bed.

## 2017-02-18 NOTE — Progress Notes (Signed)
Discharged home by wheelchair accompanied  by daughter, discharge instructions given to pt/daughter. Belongings taken home.

## 2017-02-18 NOTE — Care Management Note (Signed)
Case Management Note  Patient Details  Name: Jennise Selders MRN: [id number] Date of Birth: September 21, 1940  Subjective/Objective:   Pt admitted with SOB                 Action/Plan:   PTA independent from home with family- pt states family will provide 24/7 supervision at discharge.  Pt declined HH and DME as recommended - pt states she has cane in the home that she will use.  Pt denied barriers to obtaining and paying for medications as prescribed.  Pt will transport home via private vehicle driven by family.  No CM needs determined prior to discharge   Expected Discharge Date:  02/18/17               Expected Discharge Plan:  Home/Self Care  In-House Referral:     Discharge planning Services  CM Consult  Post Acute Care Choice:    Choice offered to:     DME Arranged:    DME Agency:     HH Arranged:    HH Agency:     Status of Service:  Completed, signed off  If discussed at [employer] of Stay Meetings, dates discussed:    Additional Comments:  Cherylann Parr, RN 02/18/2017, 1:56 PM

## 2017-02-19 LAB — T3, FREE: T3 FREE: 4.1 pg/mL (ref 2.0–4.4)

## 2017-02-20 ENCOUNTER — Emergency Department (HOSPITAL_COMMUNITY): Payer: Medicare Other

## 2017-02-20 ENCOUNTER — Inpatient Hospital Stay (HOSPITAL_COMMUNITY)
Admission: EM | Admit: 2017-02-20 | Discharge: 2017-03-25 | DRG: 175 | Disposition: E | Payer: Medicare Other | Attending: Internal Medicine | Admitting: Internal Medicine

## 2017-02-20 ENCOUNTER — Encounter (HOSPITAL_COMMUNITY): Payer: Self-pay | Admitting: Emergency Medicine

## 2017-02-20 DIAGNOSIS — I110 Hypertensive heart disease with heart failure: Secondary | ICD-10-CM | POA: Diagnosis present

## 2017-02-20 DIAGNOSIS — Z79899 Other long term (current) drug therapy: Secondary | ICD-10-CM

## 2017-02-20 DIAGNOSIS — I2609 Other pulmonary embolism with acute cor pulmonale: Secondary | ICD-10-CM | POA: Diagnosis not present

## 2017-02-20 DIAGNOSIS — J9600 Acute respiratory failure, unspecified whether with hypoxia or hypercapnia: Secondary | ICD-10-CM

## 2017-02-20 DIAGNOSIS — J9601 Acute respiratory failure with hypoxia: Secondary | ICD-10-CM | POA: Diagnosis not present

## 2017-02-20 DIAGNOSIS — E0500 Thyrotoxicosis with diffuse goiter without thyrotoxic crisis or storm: Secondary | ICD-10-CM | POA: Diagnosis present

## 2017-02-20 DIAGNOSIS — I272 Pulmonary hypertension, unspecified: Secondary | ICD-10-CM | POA: Diagnosis present

## 2017-02-20 DIAGNOSIS — Z90710 Acquired absence of both cervix and uterus: Secondary | ICD-10-CM

## 2017-02-20 DIAGNOSIS — E669 Obesity, unspecified: Secondary | ICD-10-CM | POA: Diagnosis present

## 2017-02-20 DIAGNOSIS — I959 Hypotension, unspecified: Secondary | ICD-10-CM | POA: Diagnosis present

## 2017-02-20 DIAGNOSIS — I4901 Ventricular fibrillation: Secondary | ICD-10-CM | POA: Diagnosis not present

## 2017-02-20 DIAGNOSIS — I472 Ventricular tachycardia: Secondary | ICD-10-CM | POA: Diagnosis not present

## 2017-02-20 DIAGNOSIS — F419 Anxiety disorder, unspecified: Secondary | ICD-10-CM | POA: Diagnosis present

## 2017-02-20 DIAGNOSIS — I428 Other cardiomyopathies: Secondary | ICD-10-CM | POA: Diagnosis present

## 2017-02-20 DIAGNOSIS — Z6836 Body mass index (BMI) 36.0-36.9, adult: Secondary | ICD-10-CM

## 2017-02-20 DIAGNOSIS — Z538 Procedure and treatment not carried out for other reasons: Secondary | ICD-10-CM | POA: Diagnosis not present

## 2017-02-20 DIAGNOSIS — I4581 Long QT syndrome: Secondary | ICD-10-CM | POA: Diagnosis present

## 2017-02-20 DIAGNOSIS — I2699 Other pulmonary embolism without acute cor pulmonale: Secondary | ICD-10-CM

## 2017-02-20 DIAGNOSIS — Z7982 Long term (current) use of aspirin: Secondary | ICD-10-CM

## 2017-02-20 DIAGNOSIS — R630 Anorexia: Secondary | ICD-10-CM | POA: Diagnosis present

## 2017-02-20 DIAGNOSIS — Z8249 Family history of ischemic heart disease and other diseases of the circulatory system: Secondary | ICD-10-CM

## 2017-02-20 DIAGNOSIS — R0600 Dyspnea, unspecified: Secondary | ICD-10-CM

## 2017-02-20 DIAGNOSIS — D649 Anemia, unspecified: Secondary | ICD-10-CM | POA: Diagnosis present

## 2017-02-20 DIAGNOSIS — I5023 Acute on chronic systolic (congestive) heart failure: Secondary | ICD-10-CM | POA: Diagnosis present

## 2017-02-20 DIAGNOSIS — Z9114 Patient's other noncompliance with medication regimen: Secondary | ICD-10-CM

## 2017-02-20 LAB — CBC WITH DIFFERENTIAL/PLATELET
Basophils Absolute: 0 10*3/uL (ref 0.0–0.1)
Basophils Relative: 0 %
EOS ABS: 0.1 10*3/uL (ref 0.0–0.7)
EOS PCT: 1 %
HCT: 44.7 % (ref 36.0–46.0)
Hemoglobin: 14.7 g/dL (ref 12.0–15.0)
LYMPHS ABS: 1.8 10*3/uL (ref 0.7–4.0)
LYMPHS PCT: 30 %
MCH: 27.7 pg (ref 26.0–34.0)
MCHC: 32.9 g/dL (ref 30.0–36.0)
MCV: 84.3 fL (ref 78.0–100.0)
MONO ABS: 0.8 10*3/uL (ref 0.1–1.0)
Monocytes Relative: 14 %
NEUTROS PCT: 55 %
Neutro Abs: 3.3 10*3/uL (ref 1.7–7.7)
PLATELETS: 191 10*3/uL (ref 150–400)
RBC: 5.3 MIL/uL — ABNORMAL HIGH (ref 3.87–5.11)
RDW: 13.4 % (ref 11.5–15.5)
WBC: 6 10*3/uL (ref 4.0–10.5)

## 2017-02-20 LAB — I-STAT TROPONIN, ED: TROPONIN I, POC: 0.06 ng/mL (ref 0.00–0.08)

## 2017-02-20 MED ORDER — SODIUM CHLORIDE 0.9 % IV BOLUS (SEPSIS)
500.0000 mL | Freq: Once | INTRAVENOUS | Status: DC
Start: 2017-02-20 — End: 2017-02-20

## 2017-02-20 MED ORDER — SODIUM CHLORIDE 0.9 % IV BOLUS (SEPSIS)
250.0000 mL | Freq: Once | INTRAVENOUS | Status: AC
  Administered 2017-02-20: 250 mL via INTRAVENOUS

## 2017-02-20 NOTE — ED Notes (Signed)
Bed: WA17 Expected date:  Expected time:  Means of arrival:  Comments: EMS 76 yo female short of breath-just released from hospital this past Tuesday-NRB

## 2017-02-20 NOTE — ED Provider Notes (Signed)
Medical screening examination/treatment/procedure(s) were conducted as a shared visit with non-physician practitioner(s) and myself.  I personally evaluated the patient during the encounter.   EKG Interpretation  Date/Time:  Thursday February 20 2017 22:50:53 EST Ventricular Rate:  82 PR Interval:    QRS Duration: 107 QT Interval:  412 QTC Calculation: 482 R Axis:   77 Text Interpretation:  Sinus rhythm Probable left atrial enlargement Nonspecific T abnormalities, diffuse leads Baseline wander in lead(s) I II aVR V2 Confirmed by Lorre Nick ([id number]) on 02/10/2017 10:56:81 PM      76 year old female presents with 48 hours of worsening dyspnea.  Was recently discharged from hospital 2 days ago.  EMS called and patient was hypoxemic.  Given oxygen and transported here.  On exam here she is tachypneic.  Chest x-ray without infiltrates.  Suspect pulmonary embolism.  Labs and CT pending at this time   Lorre Nick, MD 02/19/2017 2327

## 2017-02-20 NOTE — ED Provider Notes (Signed)
[facility]-EMERGENCY DEPT Provider Note   CSN: [id number] Arrival date & time: 02/09/2017  2228     History   Chief Complaint Chief Complaint  Patient presents with  . Shortness of Breath    HPI Leslie Kline is a 76 y.o. female.  The history is provided by the patient and medical records.    76 year old female with history of anasarca, anemia, congestive heart failure with estimated EF of 35-40%, hyperthyroidism, hypertension, nonischemic cardiomyopathy, prolonged QT, presenting to the ED with shortness of breath.  Patient was just released from the hospital 2 days ago after admission for hyperthyroidism with evolving storm.  She had stopped taking her methimazole about 3 months prior to admission.  States in the hospital she had several tests done and they adjusted her medications and she was feeling better when she left.  Reports since being home she has not eaten or drank any fluids in the past 2 days.  States she feels very tired and has no appetite.  States she is continued to feel short of breath, more so with exertional activity.  Denies any chest pain, palpitations, dizziness, weakness, cough, or fever.  No body aches.  States she does live at home with her 3 grandchildren who help care for her when needed.  She does use a cane to assist with ambulation.  No falls recently.  Past Medical History:  Diagnosis Date  . Anasarca    related to combination of heart failure and uncontrolled hyperthyroidism  . Anemia   . Chronic systolic heart failure (HCC)    a. dx in 2016 thought due to uncontrolled hyperthyroidism, EF 20-25% -> cath with normal coronaries. b. F/u echo 06/2016 EF 35-40%.  . Goiter    R sided, s/p L side thyroidectomy. R side goiter likely need biopsy in the future  . Hypertension   . Hyperthyroidism   . Hypokalemia   . Nonischemic cardiomyopathy (HCC)    EF 20-25% Jan 2016, cath 04/18/2014 normal coronaries (likely result of uncontrolled  hyperthyroidism)  . Nonsustained ventricular tachycardia (HCC)    a. 2016 - no persistent v-tach, asymptomatic, likely related to LV dysfunction, placed on Lifevest; subsequent recovery of EF.  . Obesity   . Prolonged Q-T interval on ECG     Patient Active Problem List   Diagnosis Date Noted  . 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 Chronic systolic heart failure/NICM 02/16/2017  . Hypertension 02/16/2017  . History of Nonsustained ventricular tachycardia  02/16/2017  . Elevated troponin 02/16/2017  . Acute respiratory failure with hypoxia (HCC) 02/16/2017  . Abnormal urinalysis 02/16/2017  . Graves disease 08/15/2015  . 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 Normal coronary arteries 04/19/2014  . Nonischemic cardiomyopathy (HCC) 04/19/2014  . Pulmonary HTN - moderate 04/19/2014  . Anasarca   . Essential hypertension   . Hypernatremia   . 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 Prolonged QT interval 04/15/2014  . Hypomagnesemia 04/15/2014  . Hypokalemia 04/15/2014  . Anemia 04/15/2014  . Dyspnea   . Acute systolic CHF (congestive heart failure) (HCC) 04/13/2014    Past Surgical History:  Procedure Laterality Date  . ABDOMINAL HYSTERECTOMY    . goiter    . LEFT AND RIGHT HEART CATHETERIZATION WITH CORONARY ANGIOGRAM N/A 04/18/2014   Procedure: LEFT AND RIGHT HEART CATHETERIZATION WITH CORONARY ANGIOGRAM;  Surgeon: Peter M Swaziland, MD;  Location[facility] CATH LAB;  Service: Cardiovascular;  Laterality: N/A;  . TONSILLECTOMY    .  TUBAL LIGATION      OB History    No data available       Home Medications    Prior to Admission medications   Medication Sig Start Date End Date Taking? Authorizing Provider  aspirin EC 81 MG EC tablet Take 1 tablet (81 mg total) by mouth daily. 04/21/14   Azalee CourseMeng, Hao, PA  carvedilol (COREG) 25 MG tablet Take 1 tablet (25 mg total) by mouth 2 (two) times daily. 02/18/17 03/20/17  Glade LloydAlekh, Kshitiz, MD  furosemide (LASIX) 80  MG tablet TAKE 1 TABLET BY MOUTH EVERY DAY 02/15/15   Jake BatheSkains, Mark C, MD  lisinopril (PRINIVIL,ZESTRIL) 10 MG tablet Take 1 tablet (10 mg total) by mouth daily. Over due for follow up  For future refills needs an appt 11/28/16   Jake BatheSkains, Mark C, MD  MAGNESIUM-OXIDE 400 (241.3 Mg) MG tablet TAKE 1 TABLET(400 MG) BY MOUTH DAILY 03/14/16   Carlus PavlovGherghe, Cristina, MD  methimazole (TAPAZOLE) 5 MG tablet 10mg  in AM and 5mg  in PM 02/18/17   Glade LloydAlekh, Kshitiz, MD  potassium chloride SA (K-DUR,KLOR-CON) 20 MEQ tablet TAKE 2 TABLETS BY MOUTH DAILY 12/19/15   Jake BatheSkains, Mark C, MD    Family History Family History  Problem Relation Age of Onset  . Heart disease Mother   . Diabetes Mother   . Diabetes Maternal Grandfather     Social History Social History   Tobacco Use  . Smoking status: Never Smoker  . Smokeless tobacco: Never Used  Substance Use Topics  . Alcohol use: No  . Drug use: No     Allergies   Patient has no known allergies.   Review of Systems Review of Systems  Respiratory: Positive for shortness of breath.   All other systems reviewed and are negative.    Physical Exam Updated Vital Signs BP (!) 83/67 (BP Location: Right Arm)   Pulse 86   Temp (!) 97.2 F (36.2 C) (Axillary)   Resp 18   SpO2 100%   Physical Exam  Constitutional: She is oriented to person, place, and time. She appears well-developed and well-nourished.  Clinically dry appearing  HENT:  Head: Normocephalic and atraumatic.  Mouth/Throat: Oropharynx is clear and moist.  Lips are chapped, cracked, and mucous membranes are extremely dry  Eyes: Conjunctivae and EOM are normal. Pupils are equal, round, and reactive to light.  Neck: Normal range of motion.  Cardiovascular: Normal rate, regular rhythm and normal heart sounds.  Pulmonary/Chest: Effort normal and breath sounds normal.  NRB on at 10L minute, patient able to remove it and talk in full sentences but appears tachypneic; lungs overall clear  Abdominal:  Soft. Bowel sounds are normal.  Musculoskeletal: Normal range of motion.  No peripheral edema  Neurological: She is alert and oriented to person, place, and time.  Skin: Skin is warm and dry.  Psychiatric: She has a normal mood and affect.  Nursing note and vitals reviewed.    ED Treatments / Results  Labs (all labs ordered are listed, but only abnormal results are displayed) Labs Reviewed  CBC WITH DIFFERENTIAL/PLATELET - Abnormal; Notable for the following components:      Result Value   RBC 5.30 (*)    All other components within normal limits  BASIC METABOLIC PANEL - Abnormal; Notable for the following components:   Glucose, Bld 125 (*)    BUN 39 (*)    Calcium 8.8 (*)    All other components within normal limits  BRAIN NATRIURETIC PEPTIDE - Abnormal;  Notable for the following components:   B Natriuretic Peptide 573.9 (*)    All other components within normal limits  TSH - Abnormal; Notable for the following components:   TSH <0.010 (*)    All other components within normal limits  MAGNESIUM  APTT  PROTIME-INR  T4, FREE  HEPARIN LEVEL (UNFRACTIONATED)  CBC  BASIC METABOLIC PANEL  TROPONIN I  TROPONIN I  TROPONIN I  CBC  I-STAT TROPONIN, ED    EKG  EKG Interpretation None       Radiology Dg Chest 2 View  Result Date: 02/21/2017 CLINICAL DATA:  Short of breath EXAM: CHEST  2 VIEW COMPARISON:  02/18/2017, 04/13/2014 FINDINGS: No acute consolidation or pleural effusion. Stable enlarged cardiomediastinal silhouette with aortic atherosclerosis. No pneumothorax. Surgical clips at the left thoracic inlet. Degenerative changes of the spine. Tracheal deviation to the left with right paratracheal opacity re- demonstrated. IMPRESSION: No active cardiopulmonary disease. Cardiomegaly. Stable right paratracheal opacity. Electronically Signed   By: Jasmine Pang M.D.   On: 02/18/2017 23:17   Ct Angio Chest Pe W And/or Wo Contrast  Result Date: 02/21/2017 CLINICAL DATA:   Hypoxia, tachycardia and dyspnea for 2 days. History of goiter , status post LEFT thyroidectomy. EXAM: CT ANGIOGRAPHY CHEST WITH CONTRAST TECHNIQUE: Multidetector CT imaging of the chest was performed using the standard protocol during bolus administration of intravenous contrast. Multiplanar CT image reconstructions and MIPs were obtained to evaluate the vascular anatomy. CONTRAST:  100 cc Isovue 370 COMPARISON:  Chest radiograph February 20, 2017 and V/Q scan February 18, 2017 FINDINGS: CARDIOVASCULAR: Adequate contrast opacification of the pulmonary artery's. Main pulmonary artery is enlarged at 4.6 cm. Central pulmonary arterial filling defects involving all lobar artery's, predominately occlusive. The heart is moderately enlarged. RIGHT heart strain (RV/LV equals 2.3). No pericardial effusion. Thoracic aorta is normal course and caliber with mild calcific atherosclerosis. MEDIASTINUM/NODES: No lymphadenopathy by CT size criteria. LUNGS/PLEURA: Tracheobronchial tree is patent, no pneumothorax. No pleural effusions, focal consolidations, pulmonary nodules or masses. UPPER ABDOMEN: 15 mm cyst in dome of the liver.  Nonacute abdomen. MUSCULOSKELETAL: At least 5.7 x 8.9 cm RIGHT thyroid displacing the trachea to the LEFT. Status post LEFT thyroidectomy. Degenerative change of the thoracic spine superimposed on congenital canal narrowing resulting in multilevel neural foraminal narrowing. Review of the MIP images confirms the above findings. IMPRESSION: 1. Acute bilateral occlusive pulmonary emboli with large clot burden. RIGHT heart strain (RV/LV Ratio = 2.3) consistent with at least submassive (intermediate risk) PE. The presence of right heart strain has been associated with an increased risk of morbidity and mortality. Please activate Code PE by paging [phone]. 2. No acute pulmonary process.  Moderate cardiomegaly. 3. RIGHT thyromegaly consistent with known goiter. 4. Critical Value/emergent results were  called by telephone at the time of interpretation on 02/21/2017 at 2:09 am to PA [facility] , who verbally acknowledged these results. Aortic Atherosclerosis (ICD10-I70.0). Electronically Signed   By: Awilda Metro M.D.   On: 02/21/2017 02:11    Procedures Procedures (including critical care time)  CRITICAL CARE Performed by: Garlon Hatchet   Total critical care time: 45 minutes  Critical care time was exclusive of separately billable procedures and treating other patients.  Critical care was necessary to treat or prevent imminent or life-threatening deterioration.  Critical care was time spent personally by me on the following activities: development of treatment plan with patient and/or surrogate as well as nursing, discussions with consultants, evaluation of patient's response to treatment,  examination of patient, obtaining history from patient or surrogate, ordering and performing treatments and interventions, ordering and review of laboratory studies, ordering and review of radiographic studies, pulse oximetry and re-evaluation of patient's condition.   Medications Ordered in ED Medications  sodium chloride 0.9 % bolus 250 mL (250 mLs Intravenous New Bag/Given 02/03/2017 2345)  iopamidol (ISOVUE-370) 76 % injection 100 mL (100 mLs Intravenous Contrast Given 02/21/17 0142)     Initial Impression / Assessment and Plan / ED Course  I have reviewed the triage vital signs and the nursing notes.  Pertinent labs & imaging results that were available during my care of the patient were reviewed by me and considered in my medical decision making (see chart for details).  76 year old female here with shortness of breath.  She was discharged from the hospital 2 days ago after admission for thyroid storm.  She is on 4 L currently, does not usually require home oxygen.  No recent cough, fever, or upper respiratory symptoms.  She does not appear significantly fluid overloaded.  Lungs are  overall clear.  She is slightly tachypneic.  On chart review, it does appear she had a VQ scan on 02/18/2017 that was indeterminant.  We will plan for screening labs, chest x-ray, and likely CT angiogram of the chest.  Patient's screening labs overall reassuring.  Troponin is negative.  BNP elevated at 573.9.  TSH still remains low.  Chest x-ray without acute findings.  Patient remained tachypneic and continued to feel short of breath.  O2 was able to be weaned down here.  CTA of the chest obtained revealing acute bilateral occlusive pulmonary emboli with evidence of right heart strain.  Patient will be admitted to critical care service, plan for lytics in the morning with IR.  Final Clinical Impressions(s) / ED Diagnoses   Final diagnoses:  Other acute pulmonary embolism with acute cor pulmonale [facility])    ED Discharge Orders    None       Garlon Hatchet, PA-C 02/21/17 [phone]

## 2017-02-20 NOTE — ED Triage Notes (Signed)
Pt comes from home via EMS with complaints of shortness of breath.  Lung sounds are clear.  Pt is not normally on oxygen.  Was recently discharged from [facility] on Tuesday with thyroid issues and feels the same as when she was admitted then.  Pt was saturating at 94% on 4L on EMS arrival.  On nonrebreather on ED arrival saturating at 100%.  A&O x4.  Ambulatory (sometimes uses cane). Vitals WNL in route.

## 2017-02-21 ENCOUNTER — Encounter (HOSPITAL_COMMUNITY): Payer: Self-pay

## 2017-02-21 ENCOUNTER — Other Ambulatory Visit: Payer: Self-pay

## 2017-02-21 ENCOUNTER — Emergency Department (HOSPITAL_COMMUNITY): Payer: Medicare Other

## 2017-02-21 ENCOUNTER — Inpatient Hospital Stay (HOSPITAL_COMMUNITY): Payer: Medicare Other

## 2017-02-21 ENCOUNTER — Inpatient Hospital Stay (HOSPITAL_COMMUNITY)
Admit: 2017-02-21 | Discharge: 2017-02-21 | Disposition: A | Discharge status: Home / Self Care | Payer: Medicare Other | Attending: Physician Assistant | Admitting: Physician Assistant

## 2017-02-21 DIAGNOSIS — Z90710 Acquired absence of both cervix and uterus: Secondary | ICD-10-CM | POA: Diagnosis not present

## 2017-02-21 DIAGNOSIS — E0500 Thyrotoxicosis with diffuse goiter without thyrotoxic crisis or storm: Secondary | ICD-10-CM | POA: Diagnosis present

## 2017-02-21 DIAGNOSIS — F419 Anxiety disorder, unspecified: Secondary | ICD-10-CM | POA: Diagnosis present

## 2017-02-21 DIAGNOSIS — I472 Ventricular tachycardia: Secondary | ICD-10-CM | POA: Diagnosis not present

## 2017-02-21 DIAGNOSIS — I5023 Acute on chronic systolic (congestive) heart failure: Secondary | ICD-10-CM | POA: Diagnosis present

## 2017-02-21 DIAGNOSIS — E0590 Thyrotoxicosis, unspecified without thyrotoxic crisis or storm: Secondary | ICD-10-CM | POA: Diagnosis not present

## 2017-02-21 DIAGNOSIS — I2699 Other pulmonary embolism without acute cor pulmonale: Secondary | ICD-10-CM

## 2017-02-21 DIAGNOSIS — R0600 Dyspnea, unspecified: Secondary | ICD-10-CM | POA: Diagnosis not present

## 2017-02-21 DIAGNOSIS — I272 Pulmonary hypertension, unspecified: Secondary | ICD-10-CM | POA: Diagnosis present

## 2017-02-21 DIAGNOSIS — E669 Obesity, unspecified: Secondary | ICD-10-CM | POA: Diagnosis present

## 2017-02-21 DIAGNOSIS — I4581 Long QT syndrome: Secondary | ICD-10-CM | POA: Diagnosis present

## 2017-02-21 DIAGNOSIS — I2609 Other pulmonary embolism with acute cor pulmonale: Principal | ICD-10-CM

## 2017-02-21 DIAGNOSIS — Z8249 Family history of ischemic heart disease and other diseases of the circulatory system: Secondary | ICD-10-CM | POA: Diagnosis not present

## 2017-02-21 DIAGNOSIS — D649 Anemia, unspecified: Secondary | ICD-10-CM | POA: Diagnosis present

## 2017-02-21 DIAGNOSIS — I509 Heart failure, unspecified: Secondary | ICD-10-CM

## 2017-02-21 DIAGNOSIS — Z6836 Body mass index (BMI) 36.0-36.9, adult: Secondary | ICD-10-CM | POA: Diagnosis not present

## 2017-02-21 DIAGNOSIS — I110 Hypertensive heart disease with heart failure: Secondary | ICD-10-CM | POA: Diagnosis present

## 2017-02-21 DIAGNOSIS — Z7982 Long term (current) use of aspirin: Secondary | ICD-10-CM | POA: Diagnosis not present

## 2017-02-21 DIAGNOSIS — J9601 Acute respiratory failure with hypoxia: Secondary | ICD-10-CM | POA: Diagnosis not present

## 2017-02-21 DIAGNOSIS — I4901 Ventricular fibrillation: Secondary | ICD-10-CM | POA: Diagnosis not present

## 2017-02-21 DIAGNOSIS — R630 Anorexia: Secondary | ICD-10-CM | POA: Diagnosis present

## 2017-02-21 DIAGNOSIS — Z79899 Other long term (current) drug therapy: Secondary | ICD-10-CM | POA: Diagnosis not present

## 2017-02-21 DIAGNOSIS — I428 Other cardiomyopathies: Secondary | ICD-10-CM | POA: Diagnosis present

## 2017-02-21 DIAGNOSIS — Z538 Procedure and treatment not carried out for other reasons: Secondary | ICD-10-CM | POA: Diagnosis not present

## 2017-02-21 DIAGNOSIS — Z9114 Patient's other noncompliance with medication regimen: Secondary | ICD-10-CM | POA: Diagnosis not present

## 2017-02-21 DIAGNOSIS — I959 Hypotension, unspecified: Secondary | ICD-10-CM | POA: Diagnosis present

## 2017-02-21 LAB — GLUCOSE, CAPILLARY
GLUCOSE-CAPILLARY: 114 mg/dL — AB (ref 65–99)
GLUCOSE-CAPILLARY: 119 mg/dL — AB (ref 65–99)

## 2017-02-21 LAB — CBC
HCT: 42.9 % (ref 36.0–46.0)
HCT: 44.5 % (ref 36.0–46.0)
Hemoglobin: 14.3 g/dL (ref 12.0–15.0)
Hemoglobin: 15 g/dL (ref 12.0–15.0)
MCH: 28.1 pg (ref 26.0–34.0)
MCH: 28 pg (ref 26.0–34.0)
MCHC: 33.3 g/dL (ref 30.0–36.0)
MCHC: 33.7 g/dL (ref 30.0–36.0)
MCV: 84.4 fL (ref 78.0–100.0)
MCV: 83.2 fL (ref 78.0–100.0)
PLATELETS: 191 10*3/uL (ref 150–400)
PLATELETS: 301 10*3/uL (ref 150–400)
RBC: 5.08 MIL/uL (ref 3.87–5.11)
RBC: 5.35 MIL/uL — ABNORMAL HIGH (ref 3.87–5.11)
RDW: 13.7 % (ref 11.5–15.5)
RDW: 13.9 % (ref 11.5–15.5)
WBC: 6.6 10*3/uL (ref 4.0–10.5)
WBC: 6.3 10*3/uL (ref 4.0–10.5)

## 2017-02-21 LAB — BASIC METABOLIC PANEL
ANION GAP: 10 (ref 5–15)
Anion gap: 10 (ref 5–15)
BUN: 32 mg/dL — AB (ref 6–20)
BUN: 39 mg/dL — ABNORMAL HIGH (ref 6–20)
CALCIUM: 8.5 mg/dL — AB (ref 8.9–10.3)
CALCIUM: 8.8 mg/dL — AB (ref 8.9–10.3)
CO2: 22 mmol/L (ref 22–32)
CO2: 23 mmol/L (ref 22–32)
CREATININE: 0.89 mg/dL (ref 0.44–1.00)
Chloride: 109 mmol/L (ref 101–111)
Chloride: 109 mmol/L (ref 101–111)
Creatinine, Ser: 0.91 mg/dL (ref 0.44–1.00)
GLUCOSE: 125 mg/dL — AB (ref 65–99)
Glucose, Bld: 128 mg/dL — ABNORMAL HIGH (ref 65–99)
POTASSIUM: 4.3 mmol/L (ref 3.5–5.1)
Potassium: 4.2 mmol/L (ref 3.5–5.1)
SODIUM: 141 mmol/L (ref 135–145)
Sodium: 142 mmol/L (ref 135–145)

## 2017-02-21 LAB — APTT: APTT: 26 s (ref 24–36)

## 2017-02-21 LAB — TROPONIN I
TROPONIN I: 0.06 ng/mL — AB (ref <0.03)
TROPONIN I: 0.07 ng/mL — AB (ref <0.03)

## 2017-02-21 LAB — HEPARIN LEVEL (UNFRACTIONATED): Heparin Unfractionated: 0.21 IU/mL — ABNORMAL LOW (ref 0.30–0.70)

## 2017-02-21 LAB — TSH

## 2017-02-21 LAB — PROTIME-INR
INR: 1.16
PROTHROMBIN TIME: 14.7 s (ref 11.4–15.2)

## 2017-02-21 LAB — MRSA PCR SCREENING: MRSA BY PCR: NEGATIVE

## 2017-02-21 LAB — MAGNESIUM: MAGNESIUM: 2.4 mg/dL (ref 1.7–2.4)

## 2017-02-21 LAB — BRAIN NATRIURETIC PEPTIDE: B Natriuretic Peptide: 573.9 pg/mL — ABNORMAL HIGH (ref 0.0–100.0)

## 2017-02-21 LAB — T4, FREE: FREE T4: 3.71 ng/dL — AB (ref 0.61–1.12)

## 2017-02-21 MED ORDER — ORAL CARE MOUTH RINSE
15.0000 mL | Freq: Two times a day (BID) | OROMUCOSAL | Status: DC
  Administered 2017-02-21 – 2017-02-24 (×4): 15 mL via OROMUCOSAL

## 2017-02-21 MED ORDER — DEXMEDETOMIDINE HCL IN NACL 200 MCG/50ML IV SOLN
0.4000 ug/kg/h | INTRAVENOUS | Status: DC
  Administered 2017-02-22: 0.2 ug/kg/h via INTRAVENOUS
  Administered 2017-02-22 (×2): 0.4 ug/kg/h via INTRAVENOUS
  Administered 2017-02-23: 0.1 ug/kg/h via INTRAVENOUS
  Filled 2017-02-21 (×3): qty 50

## 2017-02-21 MED ORDER — IOPAMIDOL (ISOVUE-370) INJECTION 76%
INTRAVENOUS | Status: AC
  Administered 2017-02-21: 100 mL via INTRAVENOUS
  Filled 2017-02-21: qty 100

## 2017-02-21 MED ORDER — PANTOPRAZOLE SODIUM 40 MG IV SOLR
40.0000 mg | Freq: Every day | INTRAVENOUS | Status: DC
  Administered 2017-02-21 – 2017-02-23 (×3): 40 mg via INTRAVENOUS
  Filled 2017-02-21 (×3): qty 40

## 2017-02-21 MED ORDER — IOPAMIDOL (ISOVUE-370) INJECTION 76%
100.0000 mL | Freq: Once | INTRAVENOUS | Status: AC | PRN
  Administered 2017-02-21: 100 mL via INTRAVENOUS

## 2017-02-21 MED ORDER — SODIUM CHLORIDE 0.9 % IV SOLN: 250.0000 mL | INTRAVENOUS | Status: DC | PRN

## 2017-02-21 MED ORDER — FENTANYL CITRATE (PF) 100 MCG/2ML IJ SOLN
INTRAMUSCULAR | Status: AC
  Filled 2017-02-21: qty 4

## 2017-02-21 MED ORDER — HEPARIN BOLUS VIA INFUSION
1000.0000 [IU] | Freq: Once | INTRAVENOUS | Status: AC
  Administered 2017-02-21: 1000 [IU] via INTRAVENOUS
  Filled 2017-02-21: qty 1000

## 2017-02-21 MED ORDER — HEPARIN (PORCINE) IN NACL 100-0.45 UNIT/ML-% IJ SOLN
1500.0000 [IU]/h | INTRAMUSCULAR | Status: DC
  Administered 2017-02-21: 1100 [IU]/h via INTRAVENOUS
  Administered 2017-02-22: 1250 [IU]/h via INTRAVENOUS
  Administered 2017-02-23 – 2017-02-24 (×3): 1500 [IU]/h via INTRAVENOUS
  Filled 2017-02-21 (×7): qty 250

## 2017-02-21 MED ORDER — SODIUM CHLORIDE 0.9 % IV SOLN
INTRAVENOUS | Status: DC
  Administered 2017-02-21: 05:00:00 via INTRAVENOUS

## 2017-02-21 MED ORDER — MIDAZOLAM HCL 2 MG/2ML IJ SOLN
INTRAMUSCULAR | Status: AC
  Filled 2017-02-21: qty 4

## 2017-02-21 MED ORDER — HEPARIN BOLUS VIA INFUSION
4000.0000 [IU] | Freq: Once | INTRAVENOUS | Status: AC
  Administered 2017-02-21: 4000 [IU] via INTRAVENOUS
  Filled 2017-02-21: qty 4000

## 2017-02-21 NOTE — Consult Note (Signed)
Chief Complaint: Patient was seen in consultation today for pulmonary arteriogram with catheter directed thrombolytic therapy/EKOS Chief Complaint  Patient presents with  . Shortness of Breath    Referring Physician(s): Aljishi,W  Supervising Physician: Simonne ComeWatts, John  [facility] ([facility])  History of Present Illness: Leslie CurtisMary Kline is a 76 y.o. female with past medical history significant for hyperthyroidism/goiter/left thyroidectomy, CHF,  NICM, hypertension, and obesity who recently presented to [facility] on 11/25 with dyspnea, tachycardia, abnormal thyroid studies and subsequently treated and discharged home on 11/27.  VQ scan at that time was indeterminate.  CT chest was not performed.  Patient then [facility]present[facility]29 PM with persistent dyspnea, tachycardia and anorexia along with hypotension.  CT angio of the chest performed today revealed acute bilateral occlusive PE with large clot burden consistent with at least submassive PE, right heart strain , moderate cardiomegaly.  Lower extremity venous Dopplers performed today were negative for DVT.  Patient is currently on IV heparin.  Labs today include normal CBC, troponin 0.06, creatinine 0.89, potassium 4.2, PT 14.7, INR 1.16.  No reported history of recent surgeries or bleeding diathesis. Request now received from critical care for catheter directed thrombolytic therapy of PE/EKOS. Past Medical History:  Diagnosis Date  . Anasarca    related to combination of heart failure and uncontrolled hyperthyroidism  . Anemia   . Chronic systolic heart failure (HCC)    a. dx in 2016 thought due to uncontrolled hyperthyroidism, EF 20-25% -> cath with normal coronaries. b. F/u echo 06/2016 EF 35-40%.  . Goiter    R sided, s/p L side thyroidectomy. R side goiter likely need biopsy in the future  . Hypertension   . Hyperthyroidism   . Hypokalemia   . Nonischemic cardiomyopathy  (HCC)    EF 20-25% Jan 2016, cath 04/18/2014 normal coronaries (likely result of uncontrolled hyperthyroidism)  . Nonsustained ventricular tachycardia (HCC)    a. 2016 - no persistent v-tach, asymptomatic, likely related to LV dysfunction, placed on Lifevest; subsequent recovery of EF.  . Obesity   . Prolonged Q-T interval on ECG     Past Surgical History:  Procedure Laterality Date  . ABDOMINAL HYSTERECTOMY    . goiter    . LEFT AND RIGHT HEART CATHETERIZATION WITH CORONARY ANGIOGRAM N/A 04/18/2014   Procedure: LEFT AND RIGHT HEART CATHETERIZATION WITH CORONARY ANGIOGRAM;  Surgeon: Peter M SwazilandJordan, MD;  [facility];  Service: Cardiovascular;  Laterality: N/A;  . TONSILLECTOMY    . TUBAL LIGATION      Allergies: Patient has no known allergies.  Medications: Prior to Admission medications   Medication Sig Start Date End Date Taking? Authorizing Provider  aspirin EC 81 MG EC tablet Take 1 tablet (81 mg total) by mouth daily. 04/21/14  Yes Azalee CourseMeng, Hao, PA  carvedilol (COREG) 25 MG tablet Take 1 tablet (25 mg total) by mouth 2 (two) times daily. 02/18/17 03/20/17 Yes Glade LloydAlekh, Kshitiz, MD  furosemide (LASIX) 80 MG tablet TAKE 1 TABLET BY MOUTH EVERY DAY 02/15/15  Yes Jake BatheSkains, Mark C, MD  lisinopril (PRINIVIL,ZESTRIL) 10 MG tablet Take 1 tablet (10 mg total) by mouth daily. Over due for follow up  For future refills needs an appt 11/28/16  Yes Jake BatheSkains, Mark C, MD  MAGNESIUM-OXIDE 400 (241.3 Mg) MG tablet TAKE 1 TABLET(400 MG) BY MOUTH DAILY 03/14/16  Yes Carlus PavlovGherghe, Cristina, MD  methimazole (TAPAZOLE) 5 MG tablet 10mg  in AM and 5mg  in  PM 02/18/17  Yes Glade Lloyd, MD  potassium chloride SA (K-DUR,KLOR-CON) 20 MEQ tablet TAKE 2 TABLETS BY MOUTH DAILY 12/19/15  Yes Jake Bathe, MD     Family History  Problem Relation Age of Onset  . Heart disease Mother   . Diabetes Mother   . Diabetes Maternal Grandfather     Social History   Socioeconomic History  . Marital status: Widowed     Spouse name: None  . Number of children: None  . Years of education: None  . Highest education level: None  Social Needs  . Financial resource strain: None  . Food insecurity - worry: None  . Food insecurity - inability: None  . Transportation needs - medical: None  . Transportation needs - non-medical: None  Occupational History  . None  Tobacco Use  . Smoking status: Never Smoker  . Smokeless tobacco: Never Used  Substance and Sexual Activity  . Alcohol use: No  . Drug use: No  . Sexual activity: None  Other Topics Concern  . None  Social History Narrative  . None      Review of Systems see above; denies fever, headache, chest pain, abdominal/back pain, nausea, vomiting or abnormal bleeding.  Vital Signs: BP (!) 123/98   Pulse 93   Temp (!) 97.4 F (36.3 C) (Oral)   Resp (!) 24   SpO2 95%   Physical Exam awake, alert.  Chest with clear breath sounds bilaterally.  Heart with regular rate and rhythm.  Abdomen obese, soft, positive bowel sounds, nontender.  No lower extremity edema. Imaging: Dg Chest 2 View  Result Date: 02/18/2017 CLINICAL DATA:  Short of breath EXAM: CHEST  2 VIEW COMPARISON:  02/18/2017, 04/13/2014 FINDINGS: No acute consolidation or pleural effusion. Stable enlarged cardiomediastinal silhouette with aortic atherosclerosis. No pneumothorax. Surgical clips at the left thoracic inlet. Degenerative changes of the spine. Tracheal deviation to the left with right paratracheal opacity re- demonstrated. IMPRESSION: No active cardiopulmonary disease. Cardiomegaly. Stable right paratracheal opacity. Electronically Signed   By: Jasmine Pang M.D.   On: 01/30/2017 23:17   Dg Chest 2 View  Result Date: 02/18/2017 CLINICAL DATA:  Shortness of breath EXAM: CHEST  2 VIEW COMPARISON:  Chest x-ray of 02/16/2017 FINDINGS: No active infiltrate or effusion is seen. Moderate cardiomegaly is stable. Mediastinal and hilar contours are unremarkable. There is deviation of the  upper tracheal air shadow to the left probably due to enlargement of the right lobe of thyroid which appears stable. There are degenerative changes throughout the mid to lower thoracic spine. IMPRESSION: One stable moderate cardiomegaly. No definite active process. Next item probable enlargement of the right lobe thyroid. Correlate clinically. Electronically Signed   By: Dwyane Dee M.D.   On: 02/18/2017 08:54   Dg Chest 2 View  Result Date: 02/16/2017 CLINICAL DATA:  76 year old female with history of shortness breath since this morning. EXAM: CHEST  2 VIEW COMPARISON:  Chest x-ray 04/13/2014. FINDINGS: Lung volumes are normal. No consolidative airspace disease. No pleural effusions. No pneumothorax. No pulmonary nodule or mass noted. Pulmonary vasculature and the cardiomediastinal silhouette are within normal limits. Atherosclerosis in the thoracic aorta. IMPRESSION: 1.  No radiographic evidence of acute cardiopulmonary disease. 2. Aortic atherosclerosis. Electronically Signed   By: Trudie Reed M.D.   On: 02/16/2017 07:48   Ct Angio Chest Pe W And/or Wo Contrast  Result Date: 02/21/2017 CLINICAL DATA:  Hypoxia, tachycardia and dyspnea for 2 days. History of goiter , status post LEFT  thyroidectomy. EXAM: CT ANGIOGRAPHY CHEST WITH CONTRAST TECHNIQUE: Multidetector CT imaging of the chest was performed using the standard protocol during bolus administration of intravenous contrast. Multiplanar CT image reconstructions and MIPs were obtained to evaluate the vascular anatomy. CONTRAST:  100 cc Isovue 370 COMPARISON:  Chest radiograph February 20, 2017 and V/Q scan February 18, 2017 FINDINGS: CARDIOVASCULAR: Adequate contrast opacification of the pulmonary artery's. Main pulmonary artery is enlarged at 4.6 cm. Central pulmonary arterial filling defects involving all lobar artery's, predominately occlusive. The heart is moderately enlarged. RIGHT heart strain (RV/LV equals 2.3). No pericardial effusion.  Thoracic aorta is normal course and caliber with mild calcific atherosclerosis. MEDIASTINUM/NODES: No lymphadenopathy by CT size criteria. LUNGS/PLEURA: Tracheobronchial tree is patent, no pneumothorax. No pleural effusions, focal consolidations, pulmonary nodules or masses. UPPER ABDOMEN: 15 mm cyst in dome of the liver.  Nonacute abdomen. MUSCULOSKELETAL: At least 5.7 x 8.9 cm RIGHT thyroid displacing the trachea to the LEFT. Status post LEFT thyroidectomy. Degenerative change of the thoracic spine superimposed on congenital canal narrowing resulting in multilevel neural foraminal narrowing. Review of the MIP images confirms the above findings. IMPRESSION: 1. Acute bilateral occlusive pulmonary emboli with large clot burden. RIGHT heart strain (RV/LV Ratio = 2.3) consistent with at least submassive (intermediate risk) PE. The presence of right heart strain has been associated with an increased risk of morbidity and mortality. Please activate Code PE by paging [phone]. 2. No acute pulmonary process.  Moderate cardiomegaly. 3. RIGHT thyromegaly consistent with known goiter. 4. Critical Value/emergent results were called by telephone at the time of interpretation on 02/21/2017 at 2:09 am to PA [facility] , who verbally acknowledged these results. Aortic Atherosclerosis (ICD10-I70.0). Electronically Signed   By: Awilda Metro M.D.   On: 02/21/2017 02:11   Nm Pulmonary Perf And Vent  Result Date: 02/18/2017 CLINICAL DATA:  Chronic shortness of breath. EXAM: NUCLEAR MEDICINE VENTILATION - PERFUSION LUNG SCAN TECHNIQUE: Ventilation images were obtained in multiple projections using inhaled aerosol Tc-8m DTPA. Perfusion images were obtained in multiple projections after intravenous injection of Tc-65m MAA. RADIOPHARMACEUTICALS:  32.2 mCi Technetium-46m DTPA aerosol inhalation and 4.2 mCi Technetium-31m MAA IV COMPARISON:  Chest x-ray 02/18/2017. FINDINGS: Bilateral matching ventilation and perfusion  defects are present making this an indeterminate scan for pulmonary embolus. Changes are most likely related to the pulmonary disease as ventilation defects are worse than perfusion defects. IMPRESSION: Indeterminate scan for pulmonary embolus. Electronically Signed   By: Maisie Fus  Register   On: 02/18/2017 11:17    Labs:  CBC: Recent Labs    02/17/17 0524 02/18/17 0255 01/30/2017 2340 02/21/17 0607  WBC 6.6 6.2 6.0 6.6  HGB 15.0 14.3 14.7 14.3  HCT 45.7 44.7 44.7 42.9  PLT 258 232 191 191    COAGS: Recent Labs    02/21/17 0235  INR 1.16  APTT 26    BMP: Recent Labs    02/17/17 1358 02/18/17 0255 02/03/2017 2340 02/21/17 0607  NA 138 139 142 141  K 4.1 4.2 4.3 4.2  CL 109 107 109 109  CO2 21* 21* 23 22  GLUCOSE 181* 148* 125* 128*  BUN 39* 43* 39* 32*  CALCIUM 9.3 9.0 8.8* 8.5*  CREATININE 1.10* 0.88 0.91 0.89  GFRNONAA 48* >60 >60 >60  GFRAA 55* >60 >60 >60    LIVER FUNCTION TESTS: Recent Labs    02/16/17 0800 02/17/17 0524 02/18/17 0255  BILITOT 1.5* 1.3* 0.8  AST 22 17 20   ALT 9* 8* 9*  ALKPHOS 93  82 78  PROT 8.0 7.6 7.1  ALBUMIN 3.7 3.5 3.4*    TUMOR MARKERS: No results for input(s): AFPTM, CEA, CA199, CHROMGRNA in the last 8760 hours.  Assessment and Plan: 76 y.o. female with past medical history significant for hyperthyroidism/goiter/left thyroidectomy, CHF,  NICM, hypertension, and obesity who recently presented to [facility] on 11/25 with dyspnea, tachycardia, abnormal thyroid studies and subsequently treated and discharged home on 11/27.  VQ scan at that time was indeterminate.  CT chest was not performed.  Patient then re-presented to [facility] on 11/29 PM with persistent dyspnea, tachycardia and anorexia along with hypotension.  CT angio of the chest performed today revealed acute bilateral occlusive PE with large clot burden consistent with at least submassive PE, right heart strain , moderate cardiomegaly.  Lower extremity  venous Dopplers performed today were negative for DVT.  Patient is currently on IV heparin.  Labs today include normal CBC, troponin 0.06, creatinine 0.89, potassium 4.2, PT 14.7, INR 1.16. Request now received from critical care for catheter directed thrombolytic therapy of PE/EKOS.  Case has been reviewed by Dr. Grace Isaac. Risks and benefits of procedure were discussed with the patient/daughter including, but not limited to bleeding, infection, vascular injury or contrast induced renal failure.  This interventional procedure involves the use of X-rays and because of the nature of the planned procedure, it is possible that we will have prolonged use of X-ray fluoroscopy.  Potential radiation risks to you include (but are not limited to) the following: - A slightly elevated risk for cancer  several years later in life. This risk is typically less than 0.5% percent. This risk is low in comparison to the normal incidence of human cancer, which is 33% for women and 50% for men according to the [employer] Cancer Society. - Radiation induced injury can include skin redness, resembling a rash, tissue breakdown / ulcers and hair loss (which can be temporary or permanent).   The likelihood of either of these occurring depends on the difficulty of the procedure and whether you are sensitive to radiation due to previous procedures, disease, or genetic conditions.   IF your procedure requires a prolonged use of radiation, you will be notified and given written instructions for further action.  It is your responsibility to monitor the irradiated area for the 2 weeks following the procedure and to notify your physician if you are concerned that you have suffered a radiation induced injury.    All of the patient's questions were answered, patient is agreeable to proceed.  Consent signed and in chart.  Patient currently awaiting transfer to [facility]  for above noted intervention.     Thank you for this  interesting consult.  I greatly enjoyed meeting Leslie Kline and look forward to participating in their care.  A copy of this report was sent to the requesting provider on this date.  Electronically Signed: D. Jeananne Rama, PA-C 02/21/2017, 11:32 AM   I spent a total of 40 minutes in face to face in clinical consultation, greater than 50% of which was counseling/coordinating care for pulmonary arteriogram with catheter directed thrombolytic therapy/EKOS

## 2017-02-21 NOTE — Progress Notes (Signed)
*  PRELIMINARY RESULTS* Vascular Ultrasound Bilateral lower extremity venous duplex has been completed.  Preliminary findings: No evidence of deep vein thrombosis in the visualized veins of the lower extremities.  Negative for baker's cysts bilaterally.  Difficult exam due to patient body habitus and penetration.   Chauncey Fischer 02/21/2017, 9:53 AM

## 2017-02-21 NOTE — ED Notes (Addendum)
Spoke with IR at [facility]. They are planning to fit the patient in at some point today when she arrives at [facility].

## 2017-02-21 NOTE — Sedation Documentation (Signed)
Pt sitting up on IR table- refuses to stop moving, pt very agitated and screaming at staff. MD in room, will return pt to her bed. Pt states over and over "I cant breathe". sats 94, refuses to have mask placed on face for more oxygen flow.

## 2017-02-21 NOTE — ED Notes (Signed)
Assisted pt to bedside commode.

## 2017-02-21 NOTE — Progress Notes (Addendum)
Called by IR that pt is unable to lie still or flat on the table.  Blood pressure 127/90, pulse (!) 130, temperature (!) 97.5 F (36.4 C), temperature source Oral, resp. rate (!) 28, SpO2 95 %. Gen:      Mild distress, obese HEENT:  EOMI, sclera anicteric Neck:     No masses; no thyromegaly Lungs:    Clear to auscultation bilaterally; normal respiratory effort CV:         Regular rate and rhythm; no murmurs Abd:      + bowel sounds; soft, non-tender; no palpable masses, no distension Ext:    No edema; adequate peripheral perfusion Skin:      Warm and dry; no rash Neuro: alert and oriented x 3  Assessment/Plan: Submassive PE Pulmonary HTN H/O CHF, chronic systolic CHF Hyperthyroidism  Pt was taken down to IR but EKOS was aborted as pt does not want to lie flat. Hemodynamics are relatively stable.  Continue with IV heparin. If she deteriorates then we will need to reassess.  She would need to be sedated heavily or placed on life support if we are to attempt EKOS again No indication for systemic TPA  Pt and family updated at bedside. Discussed with Dr. Grace Isaac, IR.  Chilton Greathouse MD [facility] Pager [phone] If no answer or after 3pm call: [phone] 02/21/2017, 5:15 PM

## 2017-02-21 NOTE — Progress Notes (Signed)
ANTICOAGULATION CONSULT NOTE - Initial Consult  Pharmacy Consult for IV heparin Indication: pulmonary embolus  No Known Allergies  Patient Measurements:   Heparin Dosing Weight: 62 kg  Vital Signs: Temp: 97.2 F (36.2 C) (11/29 2240) Temp Source: Axillary (11/29 2240) BP: 101/73 (11/30 0330) Pulse Rate: 88 (11/30 0330)  Labs: Recent Labs    02/04/2017 2340 02/21/17 0235  HGB 14.7  --   HCT 44.7  --   PLT 191  --   APTT  --  26  LABPROT  --  14.7  INR  --  1.16  CREATININE 0.91  --     Estimated Creatinine Clearance: 55.9 mL/min (by C-G formula based on SCr of 0.91 mg/dL).   Medical History: Past Medical History:  Diagnosis Date  . Anasarca    related to combination of heart failure and uncontrolled hyperthyroidism  . Anemia   . Chronic systolic heart failure (HCC)    a. dx in 2016 thought due to uncontrolled hyperthyroidism, EF 20-25% -> cath with normal coronaries. b. F/u echo 06/2016 EF 35-40%.  . Goiter    R sided, s/p L side thyroidectomy. R side goiter likely need biopsy in the future  . Hypertension   . Hyperthyroidism   . Hypokalemia   . Nonischemic cardiomyopathy (HCC)    EF 20-25% Jan 2016, cath 04/18/2014 normal coronaries (likely result of uncontrolled hyperthyroidism)  . Nonsustained ventricular tachycardia (HCC)    a. 2016 - no persistent v-tach, asymptomatic, likely related to LV dysfunction, placed on Lifevest; subsequent recovery of EF.  . Obesity   . Prolonged Q-T interval on ECG     Medications:  Scheduled:   Infusions:  . heparin 1,100 Units/hr (02/21/17 0322)    Assessment: 30 yoF with acute bilateral occlusive PE with large clot burden and right heart strain.  Baseline: H/H=14.7/44.7 and plts=191 Goal of Therapy:  Heparin level 0.3-0.7 units/ml Monitor platelets by anticoagulation protocol: Yes   Plan:  Heparin 4000 unit bolus x1 now Start drip at 1100 units/hr Daily CBC/HL Check 1st HL in 8 hours  Susanne Greenhouse  R 02/21/2017,3:59 AM

## 2017-02-21 NOTE — ED Notes (Signed)
[facility] contacted 

## 2017-02-21 NOTE — ED Notes (Signed)
Patient placed on purwick

## 2017-02-21 NOTE — Progress Notes (Signed)
ANTICOAGULATION CONSULT NOTE - Consult  Pharmacy Consult for IV heparin Indication: pulmonary embolus  No Known Allergies  Patient Measurements:   Heparin Dosing Weight: 62 kg  Vital Signs: Temp: 97.9 F (36.6 C) (11/30 2022) Temp Source: Axillary (11/30 2022) BP: 108/87 (11/30 2000) Pulse Rate: 105 (11/30 2000)  Labs: Recent Labs    02/18/2017 2340 02/21/17 0235 02/21/17 0607 02/21/17 1347 02/21/17 1935  HGB 14.7  --  14.3 15.0  --   HCT 44.7  --  42.9 44.5  --   PLT 191  --  191 301  --   APTT  --  26  --   --   --   LABPROT  --  14.7  --   --   --   INR  --  1.16  --   --   --   HEPARINUNFRC  --   --   --   --  0.21*  CREATININE 0.91  --  0.89  --   --   TROPONINI  --   --  0.06* 0.07*  --     Estimated Creatinine Clearance: 57.2 mL/min (by C-G formula based on SCr of 0.89 mg/dL).  Assessment: 9 yoF with acute bilateral occlusive PE with large clot burden and right heart strain.  Baseline: H/H=14.7/44.7 and plts=191  Pt refused EKOS d/t not wanting to lie down on table  Initial lvl 0.21 on 1100 units/hr  Goal of Therapy:  Heparin level 0.3-0.7 units/ml Monitor platelets by anticoagulation protocol: Yes   Plan:  Heparin 1000 unit bolus x1 Increase gtt to 1200 units/hr Daily CBC/HL (8 hrs until next lvl)  Isaac Bliss, PharmD, BCPS, BCCCP Clinical Pharmacist Clinical phone for 02/21/2017 from 7a-3:30p: [phone] If after 3:30p, please call main pharmacy at: [phone] 02/21/2017 8:36 PM

## 2017-02-21 NOTE — H&P (Signed)
PULMONARY / CRITICAL CARE MEDICINE   Name: Leslie Kline MRN: [id number] DOB: 28-May-1940    ADMISSION DATE:  2017/03/21 CONSULTATION DATE:  11/30  REFERRING MD:  Dr. Freida Busman EDP  CHIEF COMPLAINT:  SOB  HISTORY OF PRESENT ILLNESS:   75 year old female with PMH as below, which is significant for hyperthyroidism (poor med compliance hx RAI and recently admitted, discharged 11/27), systolic CHF, NICM, and hypertension. As mentioned, she presented 11/25 for SOB and tachycardia with TSH 0.002 and free T4 elevated. She was admitted for2 days and had medications straightened out and was discharged home. She did have a VQ scan at that time for SOB, which was "indeterminate" for PE. It is unclear why she did not get CT at that time. She was discharged to home. Since discharge she reports not eating or drinking much at all. She did initially feel better, however, 11/28 late PM she developed SOB. This persisted all day 11/29 and she again presented to ED with this complaint. CTA chest was done and demonstrated large bilateral pulmonary embolism. She did have some associated hypotension and PCCM was asked to admit.   PAST MEDICAL HISTORY :  She  has a past medical history of Anasarca, Anemia, Chronic systolic heart failure (HCC), Goiter, Hypertension, Hyperthyroidism, Hypokalemia, Nonischemic cardiomyopathy (HCC), Nonsustained ventricular tachycardia (HCC), Obesity, and Prolonged Q-T interval on ECG.  PAST SURGICAL HISTORY: She  has a past surgical history that includes Tubal ligation; goiter; Tonsillectomy; Abdominal hysterectomy; and left and right heart catheterization with coronary angiogram (N/A, 04/18/2014).  No Known Allergies  No current facility-administered medications on file prior to encounter.    Current Outpatient Medications on File Prior to Encounter  Medication Sig  . aspirin EC 81 MG EC tablet Take 1 tablet (81 mg total) by mouth daily.  . carvedilol (COREG) 25 MG tablet Take 1 tablet (25  mg total) by mouth 2 (two) times daily.  . furosemide (LASIX) 80 MG tablet TAKE 1 TABLET BY MOUTH EVERY DAY  . lisinopril (PRINIVIL,ZESTRIL) 10 MG tablet Take 1 tablet (10 mg total) by mouth daily. Over due for follow up  For future refills needs an appt  . MAGNESIUM-OXIDE 400 (241.3 Mg) MG tablet TAKE 1 TABLET(400 MG) BY MOUTH DAILY  . methimazole (TAPAZOLE) 5 MG tablet 10mg  in AM and 5mg  in PM  . potassium chloride SA (K-DUR,KLOR-CON) 20 MEQ tablet TAKE 2 TABLETS BY MOUTH DAILY    FAMILY HISTORY:  Her indicated that her mother is alive. She indicated that her father is deceased. She indicated that her maternal grandmother is deceased. She indicated that her maternal grandfather is deceased. She indicated that her paternal grandmother is deceased. She indicated that her paternal grandfather is deceased.   SOCIAL HISTORY: She  reports that  has never smoked. she has never used smokeless tobacco. She reports that she does not drink alcohol or use drugs.  REVIEW OF SYSTEMS:   Bolds are positive  Constitutional: weight loss, gain, night sweats, Fevers, chills, fatigue.  HEENT: headaches, Sore throat, sneezing, nasal congestion, post nasal drip, Difficulty swallowing, Tooth/dental problems, visual complaints visual changes, ear ache CV:  chest pain, radiates:,Orthopnea, PND, swelling in lower extremities, dizziness, palpitations, syncope.  GI  heartburn, indigestion, abdominal pain, nausea, vomiting, diarrhea, change in bowel habits, loss of appetite, bloody stools.  Resp: cough, productive: , hemoptysis, dyspnea, chest pain, pleuritic.  Skin: rash or itching or icterus GU: dysuria, change in color of urine, urgency or frequency. flank pain, hematuria  MS:  joint pain or swelling. decreased range of motion  Psych: change in mood or affect. depression or anxiety.  Neuro: difficulty with speech, weakness, numbness, ataxia    SUBJECTIVE:  Feeling better now that shes been here  VITAL  SIGNS: BP 101/73 (BP Location: Left Arm)   Pulse 88   Temp (!) 97.2 F (36.2 C) (Axillary)   Resp (!) 23   SpO2 99%   HEMODYNAMICS:    VENTILATOR SETTINGS:    INTAKE / OUTPUT: No intake/output data recorded.  PHYSICAL EXAMINATION: General:  Obese elderly female in NAD Neuro:  Alert, oriented x3 but poor historian overall.  HEENT:  [place]/AT, PERRL, No JVD Cardiovascular:  RRR, no MRG Lungs:  Clear bilateral breath sounds Abdomen:  Soft, non-tender, non-distended Musculoskeletal:  No acute deformity or ROM limitation Skin:  Grossly intact  LABS:  BMET Recent Labs  Lab 02/17/17 1358 02/18/17 0255 2016/04/17 2340  NA 138 139 142  K 4.1 4.2 4.3  CL 109 107 109  CO2 21* 21* 23  BUN 39* 43* 39*  CREATININE 1.10* 0.88 0.91  GLUCOSE 181* 148* 125*    Electrolytes Recent Labs  Lab 02/16/17 1153  02/17/17 1358 02/18/17 0255 2016/04/17 2340  CALCIUM  --    < > 9.3 9.0 8.8*  MG 1.8  --   --  2.5* 2.4  PHOS 3.1  --   --   --   --    < > = values in this interval not displayed.    CBC Recent Labs  Lab 02/17/17 0524 02/18/17 0255 2016/04/17 2340  WBC 6.6 6.2 6.0  HGB 15.0 14.3 14.7  HCT 45.7 44.7 44.7  PLT 258 232 191    Coag's Recent Labs  Lab 02/21/17 0235  APTT 26  INR 1.16    Sepsis Markers No results for input(s): LATICACIDVEN, PROCALCITON, O2SATVEN in the last 168 hours.  ABG No results for input(s): PHART, PCO2ART, PO2ART in the last 168 hours.  Liver Enzymes Recent Labs  Lab 02/16/17 0800 02/17/17 0524 02/18/17 0255  AST 22 17 20   ALT 9* 8* 9*  ALKPHOS 93 82 78  BILITOT 1.5* 1.3* 0.8  ALBUMIN 3.7 3.5 3.4*    Cardiac Enzymes Recent Labs  Lab 02/16/17 1153 02/16/17 1621 02/16/17 2255  TROPONINI 0.28* 0.25* 0.13*    Glucose Recent Labs  Lab 02/16/17 1627  GLUCAP 133*    Imaging Dg Chest 2 View  Result Date: 01/17/17 CLINICAL DATA:  Short of breath EXAM: CHEST  2 VIEW COMPARISON:  02/18/2017, 04/13/2014 FINDINGS: No  acute consolidation or pleural effusion. Stable enlarged cardiomediastinal silhouette with aortic atherosclerosis. No pneumothorax. Surgical clips at the left thoracic inlet. Degenerative changes of the spine. Tracheal deviation to the left with right paratracheal opacity re- demonstrated. IMPRESSION: No active cardiopulmonary disease. Cardiomegaly. Stable right paratracheal opacity. Electronically Signed   By: Jasmine PangKim  Fujinaga M.D.   On: 01/17/17 23:17   Ct Angio Chest Pe W And/or Wo Contrast  Result Date: 02/21/2017 CLINICAL DATA:  Hypoxia, tachycardia and dyspnea for 2 days. History of goiter , status post LEFT thyroidectomy. EXAM: CT ANGIOGRAPHY CHEST WITH CONTRAST TECHNIQUE: Multidetector CT imaging of the chest was performed using the standard protocol during bolus administration of intravenous contrast. Multiplanar CT image reconstructions and MIPs were obtained to evaluate the vascular anatomy. CONTRAST:  100 cc Isovue 370 COMPARISON:  Chest radiograph February 20, 2017 and V/Q scan February 18, 2017 FINDINGS: CARDIOVASCULAR: Adequate contrast opacification of the pulmonary artery's. Main pulmonary  artery is enlarged at 4.6 cm. Central pulmonary arterial filling defects involving all lobar artery's, predominately occlusive. The heart is moderately enlarged. RIGHT heart strain (RV/LV equals 2.3). No pericardial effusion. Thoracic aorta is normal course and caliber with mild calcific atherosclerosis. MEDIASTINUM/NODES: No lymphadenopathy by CT size criteria. LUNGS/PLEURA: Tracheobronchial tree is patent, no pneumothorax. No pleural effusions, focal consolidations, pulmonary nodules or masses. UPPER ABDOMEN: 15 mm cyst in dome of the liver.  Nonacute abdomen. MUSCULOSKELETAL: At least 5.7 x 8.9 cm RIGHT thyroid displacing the trachea to the LEFT. Status post LEFT thyroidectomy. Degenerative change of the thoracic spine superimposed on congenital canal narrowing resulting in multilevel neural foraminal  narrowing. Review of the MIP images confirms the above findings. IMPRESSION: 1. Acute bilateral occlusive pulmonary emboli with large clot burden. RIGHT heart strain (RV/LV Ratio = 2.3) consistent with at least submassive (intermediate risk) PE. The presence of right heart strain has been associated with an increased risk of morbidity and mortality. Please activate Code PE by paging [phone]. 2. No acute pulmonary process.  Moderate cardiomegaly. 3. RIGHT thyromegaly consistent with known goiter. 4. Critical Value/emergent results were called by telephone at the time of interpretation on 02/21/2017 at 2:09 am to PA [facility] , who verbally acknowledged these results. Aortic Atherosclerosis (ICD10-I70.0). Electronically Signed   By: Awilda Metro M.D.   On: 02/21/2017 02:11    STUDIES:  Echo 11/26: LVEF 30-35%, Hypokinesis of the anterior, anterolateral, lateral, inferolateral, inferior, and inferoseptal myocardium. Mod AR. RV moderately dilated, systolic function moderately reduced. RA severe dilation. VQ 11/27 > Indeterminate scan for pulmonary embolus. CT angio chest 11/30 > Acute bilateral occlusive pulmonary emboli with large clot burden. RIGHT heart strain (RV/LV Ratio = 2.3) consistent with at least submassive (intermediate risk) PE. RIGHT thyromegaly consistent with known goiter.  CULTURES: none  ANTIBIOTICS: none  SIGNIFICANT EVENTS: 11/25-11/27 admit for SOB, hyperthyroid  LINES/TUBES:   DISCUSSION: 76 year old female with hyperthyroidism and recent admission now admitted for PE. Good EKOS candidate.   ASSESSMENT / PLAN:  PULMONARY A: Pulmonary Emboli:  Bilateral but R>L. Large clot burden. RV/LV ratio 2.3. PESI class III intermediate risk. Trop neg. Could argue R heart strain S1Q3T3 on ECG as well.  P:   Supplemental O2 to keep stast > 92% (Currently 93% on 2L [place]) Heparin infusion May benefit from catheter directed lysis, will plan transfer to [facility] for  evaluation Will need venous dopplers Echo from 11/26 probably representative of current issue will not repeat at this point.  Non-compliant with meds. May need filter if DVT burden  CARDIOVASCULAR A:  Chronic systolic CHF Prolonged QTc Hx HTN  P:  Telemetry monitoring in ICU Holding home carvedilol, furosemide while hypotensive Trend troponin  RENAL A:   No acute issues  P:   Follow BMP  GASTROINTESTINAL A:   Poor appetite  P:   Npo for possible procedure  HEMATOLOGIC A:   PE - see above  P: Trend CBC Heparin infusion per pharmacy  INFECTIOUS A:   No acute issues  P:     ENDOCRINE A:   Hyperthyroidism   P:   TSH low, check T4 Continue methimazole  NEUROLOGIC A:   No acute issues  P:      FAMILY  - Updates: Daughter called and updated    - Inter-disciplinary family meet or Palliative Care meeting due by:  12/6  Joneen Roach, AGACNP-BC [facility] Pager [phone] or [phone]  02/21/2017 4:29 AM

## 2017-02-21 NOTE — ED Notes (Signed)
Patient complaining of shortness of breath. Currently on 4L O2 [place] at 95%. This RN put patient on 5L O2  and patient stayed at 95%. Will continue to monitor O2 status.

## 2017-02-21 NOTE — Sedation Documentation (Signed)
Procedure will not proceed at this moment due to pt uncooperation and inability to ly on IR table

## 2017-02-21 NOTE — ED Notes (Signed)
Spoke with RN at [facility] 45M. Will call report at 1pm.

## 2017-02-21 NOTE — ED Notes (Signed)
Patient requesting to eat. Will call hospitalist and check to see when IR procedure will be performed and if patient is able to eat anything at this time.

## 2017-02-22 LAB — CBC
HCT: 45.4 % (ref 36.0–46.0)
HEMOGLOBIN: 14.9 g/dL (ref 12.0–15.0)
MCH: 27.9 pg (ref 26.0–34.0)
MCHC: 32.8 g/dL (ref 30.0–36.0)
MCV: 85 fL (ref 78.0–100.0)
PLATELETS: 180 10*3/uL (ref 150–400)
RBC: 5.34 MIL/uL — AB (ref 3.87–5.11)
RDW: 14 % (ref 11.5–15.5)
WBC: 6.8 10*3/uL (ref 4.0–10.5)

## 2017-02-22 LAB — HEPARIN LEVEL (UNFRACTIONATED)
HEPARIN UNFRACTIONATED: 0.32 [IU]/mL (ref 0.30–0.70)
Heparin Unfractionated: 0.41 IU/mL (ref 0.30–0.70)

## 2017-02-22 NOTE — Progress Notes (Signed)
ANTICOAGULATION CONSULT NOTE - Consult  Pharmacy Consult for IV heparin Indication: pulmonary embolus  No Known Allergies  Patient Measurements: Heparin Dosing Weight: 62 kg  Vital Signs: Temp: 98.4 F (36.9 C) (12/01 0356) Temp Source: Axillary (12/01 0356) BP: 91/64 (12/01 0400) Pulse Rate: 82 (12/01 0400)  Labs: Recent Labs    02/04/2017 2340 02/21/17 0235 02/21/17 0607 02/21/17 1347 02/21/17 1935 02/22/17 0340  HGB 14.7  --  14.3 15.0  --  14.9  HCT 44.7  --  42.9 44.5  --  45.4  PLT 191  --  191 301  --  180  APTT  --  26  --   --   --   --   LABPROT  --  14.7  --   --   --   --   INR  --  1.16  --   --   --   --   HEPARINUNFRC  --   --   --   --  0.21* 0.32  CREATININE 0.91  --  0.89  --   --   --   TROPONINI  --   --  0.06* 0.07*  --   --     Estimated Creatinine Clearance: 57.2 mL/min (by C-G formula based on SCr of 0.89 mg/dL).  Assessment: 61 yoF with acute bilateral occlusive PE with large clot burden and right heart strain. Pharmacy to dose heparin.   Heparin level on low end of therapeutic range at 0.32. Hgb/plts stable, no s/s bleeding per RN.   Goal of Therapy:  Heparin level 0.3-0.7 units/ml Monitor platelets by anticoagulation protocol: Yes   Plan:  Increase heparin gtt to 1250 units/hr to keep in goal range Heparin level in 8 hrs to confirm  Daily heparin level and CBC  Monitor for s/s bleeding   Einar Crow, PharmD Clinical Pharmacist 02/22/17 5:01 AM

## 2017-02-22 NOTE — Progress Notes (Signed)
We were called to assist the patient for possibility of TPA since the patient has labile blood pressure. We had long discussion with the patient her daughter and son in the room explained to her the risks and benefits of TPA risk of bleeding the risk benefits of decreasing the clot burden this point the family elected to go ahead and continue with heparin and see how she does the transient episode of hypotension the patient had she was asymptomatic and but time we saw her her blood pressure was 111/76 according to the nursing staff looked better than yesterday.

## 2017-02-22 NOTE — Progress Notes (Signed)
ANTICOAGULATION CONSULT NOTE  Pharmacy Consult for IV heparin Indication: pulmonary embolus  No Known Allergies  Patient Measurements: Heparin Dosing Weight: 62 kg  Vital Signs: Temp: 97.5 F (36.4 C) (12/01 1209) Temp Source: Oral (12/01 1209) BP: 92/76 (12/01 1430) Pulse Rate: 89 (12/01 1430)  Labs: Recent Labs    02/04/2017 2340 02/21/17 0235 02/21/17 0607 02/21/17 1347 02/21/17 1935 02/22/17 0340 02/22/17 1314  HGB 14.7  --  14.3 15.0  --  14.9  --   HCT 44.7  --  42.9 44.5  --  45.4  --   PLT 191  --  191 301  --  180  --   APTT  --  26  --   --   --   --   --   LABPROT  --  14.7  --   --   --   --   --   INR  --  1.16  --   --   --   --   --   HEPARINUNFRC  --   --   --   --  0.21* 0.32 0.41  CREATININE 0.91  --  0.89  --   --   --   --   TROPONINI  --   --  0.06* 0.07*  --   --   --      Assessment: 22 yoF with acute bilateral occlusive PE with large clot burden and right heart strain. Heparin level therapeutic. Cbc stable.  Goal of Therapy:  Heparin level 0.3-0.7 units/ml Monitor platelets by anticoagulation protocol: Yes   Plan:  Continue heparin gtt at 1250 units/hr  Daily heparin level and CBC  Monitor for s/s bleeding  F/u plans for oral anticoagulation    Agapito Games, PharmD, BCPS Clinical Pharmacist 02/22/2017 2:46 PM

## 2017-02-22 NOTE — Progress Notes (Addendum)
PULMONARY / CRITICAL CARE MEDICINE   Name: Leslie Kline MRN: [id number] DOB: 28-May-1940    ADMISSION DATE:  2017/03/21 CONSULTATION DATE:  11/30  REFERRING MD:  Dr. Freida Busman EDP  CHIEF COMPLAINT:  SOB  HISTORY OF PRESENT ILLNESS:   75 year old female with PMH as below, which is significant for hyperthyroidism (poor med compliance hx RAI and recently admitted, discharged 11/27), systolic CHF, NICM, and hypertension. As mentioned, she presented 11/25 for SOB and tachycardia with TSH 0.002 and free T4 elevated. She was admitted for2 days and had medications straightened out and was discharged home. She did have a VQ scan at that time for SOB, which was "indeterminate" for PE. It is unclear why she did not get CT at that time. She was discharged to home. Since discharge she reports not eating or drinking much at all. She did initially feel better, however, 11/28 late PM she developed SOB. This persisted all day 11/29 and she again presented to ED with this complaint. CTA chest was done and demonstrated large bilateral pulmonary embolism. She did have some associated hypotension and PCCM was asked to admit.   PAST MEDICAL HISTORY :  She  has a past medical history of Anasarca, Anemia, Chronic systolic heart failure (HCC), Goiter, Hypertension, Hyperthyroidism, Hypokalemia, Nonischemic cardiomyopathy (HCC), Nonsustained ventricular tachycardia (HCC), Obesity, and Prolonged Q-T interval on ECG.  PAST SURGICAL HISTORY: She  has a past surgical history that includes Tubal ligation; goiter; Tonsillectomy; Abdominal hysterectomy; and left and right heart catheterization with coronary angiogram (N/A, 04/18/2014).  No Known Allergies  No current facility-administered medications on file prior to encounter.    Current Outpatient Medications on File Prior to Encounter  Medication Sig  . aspirin EC 81 MG EC tablet Take 1 tablet (81 mg total) by mouth daily.  . carvedilol (COREG) 25 MG tablet Take 1 tablet (25  mg total) by mouth 2 (two) times daily.  . furosemide (LASIX) 80 MG tablet TAKE 1 TABLET BY MOUTH EVERY DAY  . lisinopril (PRINIVIL,ZESTRIL) 10 MG tablet Take 1 tablet (10 mg total) by mouth daily. Over due for follow up  For future refills needs an appt  . MAGNESIUM-OXIDE 400 (241.3 Mg) MG tablet TAKE 1 TABLET(400 MG) BY MOUTH DAILY  . methimazole (TAPAZOLE) 5 MG tablet 10mg  in AM and 5mg  in PM  . potassium chloride SA (K-DUR,KLOR-CON) 20 MEQ tablet TAKE 2 TABLETS BY MOUTH DAILY    FAMILY HISTORY:  Her indicated that her mother is alive. She indicated that her father is deceased. She indicated that her maternal grandmother is deceased. She indicated that her maternal grandfather is deceased. She indicated that her paternal grandmother is deceased. She indicated that her paternal grandfather is deceased.   SOCIAL HISTORY: She  reports that  has never smoked. she has never used smokeless tobacco. She reports that she does not drink alcohol or use drugs.  REVIEW OF SYSTEMS:   Bolds are positive  Constitutional: weight loss, gain, night sweats, Fevers, chills, fatigue.  HEENT: headaches, Sore throat, sneezing, nasal congestion, post nasal drip, Difficulty swallowing, Tooth/dental problems, visual complaints visual changes, ear ache CV:  chest pain, radiates:,Orthopnea, PND, swelling in lower extremities, dizziness, palpitations, syncope.  GI  heartburn, indigestion, abdominal pain, nausea, vomiting, diarrhea, change in bowel habits, loss of appetite, bloody stools.  Resp: cough, productive: , hemoptysis, dyspnea, chest pain, pleuritic.  Skin: rash or itching or icterus GU: dysuria, change in color of urine, urgency or frequency. flank pain, hematuria  MS:  joint pain or swelling. decreased range of motion  Psych: change in mood or affect. depression or anxiety.  Neuro: difficulty with speech, weakness, numbness, ataxia   SUBJECTIVE:  EKOS cancelled yesterday as pt would not lie  still. Started on precedex drip Appears comfortable today AM  VITAL SIGNS: BP (!) 89/68   Pulse 84   Temp 97.6 F (36.4 C) (Oral)   Resp (!) 23   Wt 201 lb 11.5 oz (91.5 kg)   SpO2 95%   BMI 36.90 kg/m   HEMODYNAMICS:    VENTILATOR SETTINGS:    INTAKE / OUTPUT: I/O last 3 completed shifts: In: 541.6 [I.V.:541.6] Out: 700 [Urine:700]  PHYSICAL EXAMINATION: Gen:      No acute distress, obese, no distress HEENT:  EOMI, sclera anicteric Neck:     No masses; no thyromegaly Lungs:    Clear to auscultation bilaterally; normal respiratory effort CV:         Regular rate and rhythm; no murmurs Abd:      + bowel sounds; soft, non-tender; no palpable masses, no distension Ext:    No edema; adequate peripheral perfusion Skin:      Warm and dry; no rash Neuro: alert and oriented x 3 Psych: normal mood and affect  LABS:  BMET Recent Labs  Lab 02/18/17 0255 10-16-2016 2340 02/21/17 0607  NA 139 142 141  K 4.2 4.3 4.2  CL 107 109 109  CO2 21* 23 22  BUN 43* 39* 32*  CREATININE 0.88 0.91 0.89  GLUCOSE 148* 125* 128*    Electrolytes Recent Labs  Lab 02/16/17 1153  02/18/17 0255 10-16-2016 2340 02/21/17 0607  CALCIUM  --    < > 9.0 8.8* 8.5*  MG 1.8  --  2.5* 2.4  --   PHOS 3.1  --   --   --   --    < > = values in this interval not displayed.    CBC Recent Labs  Lab 02/21/17 0607 02/21/17 1347 02/22/17 0340  WBC 6.6 6.3 6.8  HGB 14.3 15.0 14.9  HCT 42.9 44.5 45.4  PLT 191 301 180    Coag's Recent Labs  Lab 02/21/17 0235  APTT 26  INR 1.16    Sepsis Markers No results for input(s): LATICACIDVEN, PROCALCITON, O2SATVEN in the last 168 hours.  ABG No results for input(s): PHART, PCO2ART, PO2ART in the last 168 hours.  Liver Enzymes Recent Labs  Lab 02/16/17 0800 02/17/17 0524 02/18/17 0255  AST 22 17 20   ALT 9* 8* 9*  ALKPHOS 93 82 78  BILITOT 1.5* 1.3* 0.8  ALBUMIN 3.7 3.5 3.4*    Cardiac Enzymes Recent Labs  Lab 02/16/17 2255  02/21/17 0607 02/21/17 1347  TROPONINI 0.13* 0.06* 0.07*    Glucose Recent Labs  Lab 02/16/17 1627 02/21/17 1500 02/21/17 2024  GLUCAP 133* 114* 119*    Imaging No results found.  STUDIES:  Echo 11/26: LVEF 30-35%, Hypokinesis of the anterior, anterolateral, lateral, inferolateral, inferior, and inferoseptal myocardium. Mod AR. RV moderately dilated, systolic function moderately reduced. RA severe dilation. VQ 11/27 > Indeterminate scan for pulmonary embolus. CT angio chest 11/30 > Acute bilateral occlusive pulmonary emboli with large clot burden. RIGHT heart strain (RV/LV Ratio = 2.3) consistent with at least submassive (intermediate risk) PE. RIGHT thyromegaly consistent with known goiter.  CULTURES: none  ANTIBIOTICS: none  SIGNIFICANT EVENTS: 11/25-11/27 admit for SOB, hyperthyroid  LINES/TUBES:   DISCUSSION: 76 year old female with hyperthyroidism and recent admission now admitted  for PE. Good EKOS candidate.   ASSESSMENT / PLAN:  PULMONARY A: Pulmonary Emboli:  Bilateral but R>L. Large clot burden. RV/LV ratio 2.3. PESI class III intermediate risk. Trop neg. Could argue R heart strain S1Q3T3 on ECG as well.  P:   Supplemental O2 to keep stast > 92% (Currently 93% on 2L [place]) Heparin infusion. EKOS cancelled No need for systemic TPA Follow venous dopplers Echo from 11/26 probably representative of current issue will not repeat at this point.  Non-compliant with meds. May need filter if DVT burden  CARDIOVASCULAR A:  Chronic systolic CHF Prolonged QTc Hx HTN  P:  Telemetry monitoring in ICU Holding home carvedilol, furosemide while hypotensive Trend troponin  RENAL A:   No acute issues  P:   Follow BMP  GASTROINTESTINAL A:   Poor appetite  P:   Advance diet as toelerated  HEMATOLOGIC A:   PE - see above  P: Trend CBC Heparin infusion per pharmacy  INFECTIOUS A:   No acute issues  P:     ENDOCRINE A:   Hyperthyroidism    P:   TSH low, followT4 Continue methimazole  NEUROLOGIC A:   Anxiety  P:   On precedex. Wean down as tolerated  FAMILY  - Updates: Family updated at bedside - Inter-disciplinary family meet or Palliative Care meeting due by:  12/6.  The patient is critically ill with multiple organ system failure and requires high complexity decision making for assessment and support, frequent evaluation and titration of therapies, advanced monitoring, review of radiographic studies and interpretation of complex data.   Critical Care Time devoted to patient care services, exclusive of separately billable procedures, described in this note is 35 minutes.   Chilton Greathouse MD [facility] Pager [phone] If no answer or after 3pm call: [phone] 02/22/2017, 9:32 AM

## 2017-02-22 NOTE — Progress Notes (Signed)
eLink Physician-Brief Progress Note Patient Name: Leslie Kline DOB: Aug 10, 1940 MRN: [id number]   Date of Service  02/22/2017  HPI/Events of Note  Bedside nurse reports increasing SOB. Patient admitted with PE and was too SOB to lie down for EKOS. BP = 98/72 and HR = 104. Sat = 94% and RR high 20's to low 30's.   eICU Interventions  Plan: 1. Will send ground team to evaluate at bedside for evaluation for: a) Repeat EKOS attempt vs b) Systemic Lytics vs c) Continue present management. May be reasonable to consider systemic thrombolysis if she can't tolerate lying down for EKOS given the fact that her respiratory status is so tenuous.      Intervention Category Major Interventions: Hypoxemia - evaluation and management  Leslie Kline [name] 02/22/2017, 8:56 PM

## 2017-02-22 DEATH — deceased

## 2017-02-23 ENCOUNTER — Inpatient Hospital Stay (HOSPITAL_COMMUNITY): Payer: Medicare Other

## 2017-02-23 LAB — CBC
HEMATOCRIT: 46.1 % — AB (ref 36.0–46.0)
HEMOGLOBIN: 15.2 g/dL — AB (ref 12.0–15.0)
MCH: 28.2 pg (ref 26.0–34.0)
MCHC: 33 g/dL (ref 30.0–36.0)
MCV: 85.5 fL (ref 78.0–100.0)
Platelets: 183 10*3/uL (ref 150–400)
RBC: 5.39 MIL/uL — AB (ref 3.87–5.11)
RDW: 14.4 % (ref 11.5–15.5)
WBC: 8.2 10*3/uL (ref 4.0–10.5)

## 2017-02-23 LAB — BASIC METABOLIC PANEL
ANION GAP: 11 (ref 5–15)
BUN: 29 mg/dL — ABNORMAL HIGH (ref 6–20)
CO2: 19 mmol/L — AB (ref 22–32)
Calcium: 8.5 mg/dL — ABNORMAL LOW (ref 8.9–10.3)
Chloride: 112 mmol/L — ABNORMAL HIGH (ref 101–111)
Creatinine, Ser: 0.88 mg/dL (ref 0.44–1.00)
GFR calc non Af Amer: >60 mL/min (ref >60)
GLUCOSE: 100 mg/dL — AB (ref 65–99)
POTASSIUM: 4.5 mmol/L (ref 3.5–5.1)
Sodium: 142 mmol/L (ref 135–145)

## 2017-02-23 LAB — PHOSPHORUS: PHOSPHORUS: 3.7 mg/dL (ref 2.5–4.6)

## 2017-02-23 LAB — HEPARIN LEVEL (UNFRACTIONATED)
HEPARIN UNFRACTIONATED: 0.53 [IU]/mL (ref 0.30–0.70)
Heparin Unfractionated: 0.26 IU/mL — ABNORMAL LOW (ref 0.30–0.70)

## 2017-02-23 LAB — MAGNESIUM: Magnesium: 2.3 mg/dL (ref 1.7–2.4)

## 2017-02-23 MED ORDER — CLONAZEPAM 0.5 MG PO TABS
0.2500 mg | ORAL_TABLET | Freq: Three times a day (TID) | ORAL | Status: DC | PRN
  Administered 2017-02-23 – 2017-02-24 (×4): 0.25 mg via ORAL
  Filled 2017-02-23 (×5): qty 1

## 2017-02-23 MED ORDER — HEPARIN BOLUS VIA INFUSION
1500.0000 [IU] | Freq: Once | INTRAVENOUS | Status: AC
  Administered 2017-02-23: 1500 [IU] via INTRAVENOUS
  Filled 2017-02-23: qty 1500

## 2017-02-23 MED ORDER — FUROSEMIDE 40 MG PO TABS
40.0000 mg | ORAL_TABLET | Freq: Every day | ORAL | Status: DC
  Administered 2017-02-23 – 2017-02-24 (×2): 40 mg via ORAL
  Filled 2017-02-23 (×2): qty 1

## 2017-02-23 NOTE — Progress Notes (Signed)
PULMONARY / CRITICAL CARE MEDICINE   Name: Leslie Kline MRN: [id number] DOB: 28-May-1940    ADMISSION DATE:  2017/03/21 CONSULTATION DATE:  11/30  REFERRING MD:  Dr. Freida Busman EDP  CHIEF COMPLAINT:  SOB  HISTORY OF PRESENT ILLNESS:   75 year old female with PMH as below, which is significant for hyperthyroidism (poor med compliance hx RAI and recently admitted, discharged 11/27), systolic CHF, NICM, and hypertension. As mentioned, she presented 11/25 for SOB and tachycardia with TSH 0.002 and free T4 elevated. She was admitted for2 days and had medications straightened out and was discharged home. She did have a VQ scan at that time for SOB, which was "indeterminate" for PE. It is unclear why she did not get CT at that time. She was discharged to home. Since discharge she reports not eating or drinking much at all. She did initially feel better, however, 11/28 late PM she developed SOB. This persisted all day 11/29 and she again presented to ED with this complaint. CTA chest was done and demonstrated large bilateral pulmonary embolism. She did have some associated hypotension and PCCM was asked to admit.   PAST MEDICAL HISTORY :  She  has a past medical history of Anasarca, Anemia, Chronic systolic heart failure (HCC), Goiter, Hypertension, Hyperthyroidism, Hypokalemia, Nonischemic cardiomyopathy (HCC), Nonsustained ventricular tachycardia (HCC), Obesity, and Prolonged Q-T interval on ECG.  PAST SURGICAL HISTORY: She  has a past surgical history that includes Tubal ligation; goiter; Tonsillectomy; Abdominal hysterectomy; and left and right heart catheterization with coronary angiogram (N/A, 04/18/2014).  No Known Allergies  No current facility-administered medications on file prior to encounter.    Current Outpatient Medications on File Prior to Encounter  Medication Sig  . aspirin EC 81 MG EC tablet Take 1 tablet (81 mg total) by mouth daily.  . carvedilol (COREG) 25 MG tablet Take 1 tablet (25  mg total) by mouth 2 (two) times daily.  . furosemide (LASIX) 80 MG tablet TAKE 1 TABLET BY MOUTH EVERY DAY  . lisinopril (PRINIVIL,ZESTRIL) 10 MG tablet Take 1 tablet (10 mg total) by mouth daily. Over due for follow up  For future refills needs an appt  . MAGNESIUM-OXIDE 400 (241.3 Mg) MG tablet TAKE 1 TABLET(400 MG) BY MOUTH DAILY  . methimazole (TAPAZOLE) 5 MG tablet 10mg  in AM and 5mg  in PM  . potassium chloride SA (K-DUR,KLOR-CON) 20 MEQ tablet TAKE 2 TABLETS BY MOUTH DAILY    FAMILY HISTORY:  Her indicated that her mother is alive. She indicated that her father is deceased. She indicated that her maternal grandmother is deceased. She indicated that her maternal grandfather is deceased. She indicated that her paternal grandmother is deceased. She indicated that her paternal grandfather is deceased.   SOCIAL HISTORY: She  reports that  has never smoked. she has never used smokeless tobacco. She reports that she does not drink alcohol or use drugs.  REVIEW OF SYSTEMS:   Bolds are positive  Constitutional: weight loss, gain, night sweats, Fevers, chills, fatigue.  HEENT: headaches, Sore throat, sneezing, nasal congestion, post nasal drip, Difficulty swallowing, Tooth/dental problems, visual complaints visual changes, ear ache CV:  chest pain, radiates:,Orthopnea, PND, swelling in lower extremities, dizziness, palpitations, syncope.  GI  heartburn, indigestion, abdominal pain, nausea, vomiting, diarrhea, change in bowel habits, loss of appetite, bloody stools.  Resp: cough, productive: , hemoptysis, dyspnea, chest pain, pleuritic.  Skin: rash or itching or icterus GU: dysuria, change in color of urine, urgency or frequency. flank pain, hematuria  MS:  joint pain or swelling. decreased range of motion  Psych: change in mood or affect. depression or anxiety.  Neuro: difficulty with speech, weakness, numbness, ataxia   SUBJECTIVE:  Back on low dose predecex drip overnight. Comfortable,  stable vitals   VITAL SIGNS: BP 97/73 (BP Location: Left Arm)   Pulse 91   Temp (!) 97.5 F (36.4 C) (Oral)   Resp (!) 23   Wt 201 lb 11.5 oz (91.5 kg)   SpO2 96%   BMI 36.90 kg/m   HEMODYNAMICS:    VENTILATOR SETTINGS:    INTAKE / OUTPUT: I/O last 3 completed shifts: In: 919 [I.V.:919] Out: 950 [Urine:950]  PHYSICAL EXAMINATION: Gen:      No acute distress, obese, no distress HEENT:  EOMI, sclera anicteric Neck:     No masses; no thyromegaly Lungs:    Clear to auscultation bilaterally; normal respiratory effort CV:         Regular rate and rhythm; no murmurs Abd:      + bowel sounds; soft, non-tender; no palpable masses, no distension Ext:    No edema; adequate peripheral perfusion Skin:      Warm and dry; no rash Neuro: alert and oriented x 3 Psych: normal mood and affect  LABS:  BMET Recent Labs  Lab 01/26/2017 2340 02/21/17 0607 02/23/17 0501  NA 142 141 142  K 4.3 4.2 4.5  CL 109 109 112*  CO2 23 22 19*  BUN 39* 32* 29*  CREATININE 0.91 0.89 0.88  GLUCOSE 125* 128* 100*    Electrolytes Recent Labs  Lab 02/16/17 1153  02/18/17 0255 02/12/2017 2340 02/21/17 0607 02/23/17 0501  CALCIUM  --    < > 9.0 8.8* 8.5* 8.5*  MG 1.8  --  2.5* 2.4  --  2.3  PHOS 3.1  --   --   --   --  3.7   < > = values in this interval not displayed.    CBC Recent Labs  Lab 02/21/17 1347 02/22/17 0340 02/23/17 0501  WBC 6.3 6.8 8.2  HGB 15.0 14.9 15.2*  HCT 44.5 45.4 46.1*  PLT 301 180 183    Coag's Recent Labs  Lab 02/21/17 0235  APTT 26  INR 1.16    Sepsis Markers No results for input(s): LATICACIDVEN, PROCALCITON, O2SATVEN in the last 168 hours.  ABG No results for input(s): PHART, PCO2ART, PO2ART in the last 168 hours.  Liver Enzymes Recent Labs  Lab 02/17/17 0524 02/18/17 0255  AST 17 20  ALT 8* 9*  ALKPHOS 82 78  BILITOT 1.3* 0.8  ALBUMIN 3.5 3.4*    Cardiac Enzymes Recent Labs  Lab 02/16/17 2255 02/21/17 0607 02/21/17 1347   TROPONINI 0.13* 0.06* 0.07*    Glucose Recent Labs  Lab 02/16/17 1627 02/21/17 1500 02/21/17 2024  GLUCAP 133* 114* 119*    Imaging Dg Chest Port 1 View  Result Date: 02/23/2017 CLINICAL DATA:  Acute respiratory failure EXAM: PORTABLE CHEST 1 VIEW COMPARISON:  01/27/2017 FINDINGS: Cardiomegaly. Tortuosity of the thoracic aorta. No confluent airspace opacities or effusions. Again noted is deviation of the trachea to the left related to enlarged right thyroid lobe. IMPRESSION: Cardiomegaly.  No active disease. Electronically Signed   By: Charlett NoseKevin  Dover M.D.   On: 02/23/2017 07:32    STUDIES:  Echo 11/26: LVEF 30-35%, Hypokinesis of the anterior, anterolateral, lateral, inferolateral, inferior, and inferoseptal myocardium. Mod AR. RV moderately dilated, systolic function moderately reduced. RA severe dilation. VQ 11/27 > Indeterminate scan for  pulmonary embolus. CT angio chest 11/30 > Acute bilateral occlusive pulmonary emboli with large clot burden. RIGHT heart strain (RV/LV Ratio = 2.3) consistent with at least submassive (intermediate risk) PE. RIGHT thyromegaly consistent with known goiter. LE duplex 11/30- No DVT  CULTURES: none  ANTIBIOTICS: none  SIGNIFICANT EVENTS: 11/25-11/27 admit for SOB, hyperthyroid 11/30- Admit with submassive PE. EKOS cancelled  LINES/TUBES:  DISCUSSION: 76 year old female with hyperthyroidism and recent admission now admitted for submassive PE.  Patient was sent down for records.  Procedure to be aborted as she cannot lie flat. Risk benefits of systemic TPA were discussed extensively with the patient and they declined.  We will continue heparin drip as she appears stable hemodynamically.   ASSESSMENT / PLAN:  PULMONARY A: Pulmonary Emboli:  Bilateral but R>L. Large clot burden. RV/LV ratio 2.3. PESI class III intermediate risk. Trop neg. Could argue R heart strain S1Q3T3 on ECG as well.  P:   Continue supplemental O2. Heparin infusion.  EKOS cancelled No need for systemic TPA Echo from 11/26 probably representative of current issue will not repeat at this point.   CARDIOVASCULAR A:  Chronic systolic CHF Prolonged QTc Hx HTN  P:  Telemetry monitoring in ICU Holding home carvedilol while hypotensive Resume home Lasix at half dose Trend troponin  RENAL A:   No acute issues  P:   Follow BMP  GASTROINTESTINAL A:   Poor appetite  P:   Advance diet as tolerated  HEMATOLOGIC A:   PE - see above  P: Trend CBC Heparin infusion per pharmacy  INFECTIOUS A:   No acute issues  P:     ENDOCRINE A:   Hyperthyroidism   P:   TSH low Continue methimazole  NEUROLOGIC A:   Anxiety  P:   Take off Precedex drip Klonopin as needed for anxiety  FAMILY  - Updates: Family updated at bedside - Inter-disciplinary family meet or Palliative Care meeting due by:  12/6.  Stable for transfer to SDU  Chilton Greathouse MD [facility] Pager [phone] If no answer or after 3pm call: [phone] 02/23/2017, 8:48 AM

## 2017-02-23 NOTE — Progress Notes (Signed)
ANTICOAGULATION CONSULT NOTE - Follow Up Consult  Pharmacy Consult for heparin Indication: pulmonary embolus  Labs: Recent Labs    01/24/2017 2340 02/21/17 0235 02/21/17 0607 02/21/17 1347  02/22/17 0340 02/22/17 1314 02/23/17 0501  HGB 14.7  --  14.3 15.0  --  14.9  --  15.2*  HCT 44.7  --  42.9 44.5  --  45.4  --  46.1*  PLT 191  --  191 301  --  180  --  183  APTT  --  26  --   --   --   --   --   --   LABPROT  --  14.7  --   --   --   --   --   --   INR  --  1.16  --   --   --   --   --   --   HEPARINUNFRC  --   --   --   --    < > 0.32 0.41 0.26*  CREATININE 0.91  --  0.89  --   --   --   --   --   TROPONINI  --   --  0.06* 0.07*  --   --   --   --    < > = values in this interval not displayed.    Assessment: 76yo female now below goal on heparin after two levels at low end of goal; given large clot burden and RHS would prefer higher end of goal.  Goal of Therapy:  Heparin level 0.3-0.7 units/ml   Plan:  Will rebolus with heparin 1500 units/hr and increase gtt by 2-3 units/kg/hr to 1500 units/hr and check level in 6hr.  Vernard Gambles, PharmD, BCPS  02/23/2017,6:17 AM

## 2017-02-23 NOTE — Progress Notes (Signed)
ANTICOAGULATION CONSULT NOTE  Pharmacy Consult for IV heparin Indication: pulmonary embolus  No Known Allergies  Patient Measurements: Heparin Dosing Weight: 62 kg  Vital Signs: Temp: 97.5 F (36.4 C) (12/02 1159) Temp Source: Oral (12/02 1159) BP: 113/55 (12/02 1600) Pulse Rate: 105 (12/02 1600)  Labs: Recent Labs    01/27/2017 2340 02/21/17 0235 02/21/17 0607 02/21/17 1347  02/22/17 0340 02/22/17 1314 02/23/17 0501 02/23/17 1401  HGB 14.7  --  14.3 15.0  --  14.9  --  15.2*  --   HCT 44.7  --  42.9 44.5  --  45.4  --  46.1*  --   PLT 191  --  191 301  --  180  --  183  --   APTT  --  26  --   --   --   --   --   --   --   LABPROT  --  14.7  --   --   --   --   --   --   --   INR  --  1.16  --   --   --   --   --   --   --   HEPARINUNFRC  --   --   --   --    < > 0.32 0.41 0.26* 0.53  CREATININE 0.91  --  0.89  --   --   --   --  0.88  --   TROPONINI  --   --  0.06* 0.07*  --   --   --   --   --    < > = values in this interval not displayed.     Assessment: 16 yoF with acute bilateral occlusive PE with large clot burden and right heart strain. Heparin level therapeutic. Cbc stable.  Goal of Therapy:  Heparin level 0.3-0.7 units/ml Monitor platelets by anticoagulation protocol: Yes   Plan:  Continue heparin gtt at 1500 units/hr  Daily heparin level and CBC  Monitor for s/s bleeding  F/u plans for oral anticoagulation    Agapito Games, PharmD, BCPS Clinical Pharmacist 02/23/2017 4:19 PM

## 2017-02-24 DIAGNOSIS — I272 Pulmonary hypertension, unspecified: Secondary | ICD-10-CM

## 2017-02-24 DIAGNOSIS — I5023 Acute on chronic systolic (congestive) heart failure: Secondary | ICD-10-CM

## 2017-02-24 DIAGNOSIS — I2699 Other pulmonary embolism without acute cor pulmonale: Secondary | ICD-10-CM

## 2017-02-24 DIAGNOSIS — E0590 Thyrotoxicosis, unspecified without thyrotoxic crisis or storm: Secondary | ICD-10-CM

## 2017-02-24 DIAGNOSIS — I428 Other cardiomyopathies: Secondary | ICD-10-CM

## 2017-02-24 LAB — CBC
HEMATOCRIT: 45.6 % (ref 36.0–46.0)
HEMOGLOBIN: 14.9 g/dL (ref 12.0–15.0)
MCH: 27.7 pg (ref 26.0–34.0)
MCHC: 32.7 g/dL (ref 30.0–36.0)
MCV: 84.8 fL (ref 78.0–100.0)
Platelets: 153 10*3/uL (ref 150–400)
RBC: 5.38 MIL/uL — AB (ref 3.87–5.11)
RDW: 14.2 % (ref 11.5–15.5)
WBC: 8.7 10*3/uL (ref 4.0–10.5)

## 2017-02-24 LAB — BASIC METABOLIC PANEL
ANION GAP: 12 (ref 5–15)
BUN: 24 mg/dL — ABNORMAL HIGH (ref 6–20)
CHLORIDE: 108 mmol/L (ref 101–111)
CO2: 20 mmol/L — AB (ref 22–32)
Calcium: 8.3 mg/dL — ABNORMAL LOW (ref 8.9–10.3)
Creatinine, Ser: 0.85 mg/dL (ref 0.44–1.00)
GFR calc non Af Amer: >60 mL/min (ref >60)
Glucose, Bld: 100 mg/dL — ABNORMAL HIGH (ref 65–99)
POTASSIUM: 4.3 mmol/L (ref 3.5–5.1)
Sodium: 140 mmol/L (ref 135–145)

## 2017-02-24 LAB — MAGNESIUM: MAGNESIUM: 2.1 mg/dL (ref 1.7–2.4)

## 2017-02-24 LAB — PHOSPHORUS: PHOSPHORUS: 3.6 mg/dL (ref 2.5–4.6)

## 2017-02-24 LAB — HEPARIN LEVEL (UNFRACTIONATED): Heparin Unfractionated: 0.39 IU/mL (ref 0.30–0.70)

## 2017-02-24 MED ORDER — POTASSIUM CHLORIDE CRYS ER 20 MEQ PO TBCR: 40.0000 meq | EXTENDED_RELEASE_TABLET | Freq: Every day | ORAL | Status: DC

## 2017-02-24 MED ORDER — TENECTEPLASE 50 MG IV KIT
50.0000 mg | PACK | Freq: Once | INTRAVENOUS | Status: AC
  Administered 2017-02-24: 50 mg via INTRAVENOUS
  Filled 2017-02-24: qty 10

## 2017-02-24 MED ORDER — ZOLPIDEM TARTRATE 5 MG PO TABS
5.0000 mg | ORAL_TABLET | Freq: Once | ORAL | Status: AC
  Administered 2017-02-24: 5 mg via ORAL
  Filled 2017-02-24: qty 1

## 2017-02-24 MED ORDER — FUROSEMIDE 80 MG PO TABS
80.0000 mg | ORAL_TABLET | Freq: Every day | ORAL | Status: DC
Start: 2017-02-25 — End: 2017-02-25

## 2017-02-24 MED ORDER — ACETAMINOPHEN 325 MG PO TABS
650.0000 mg | ORAL_TABLET | Freq: Four times a day (QID) | ORAL | Status: DC | PRN
  Administered 2017-02-24: 650 mg via ORAL
  Filled 2017-02-24: qty 2

## 2017-02-24 MED ORDER — METHIMAZOLE 5 MG PO TABS: 10.0000 mg | ORAL_TABLET | Freq: Every day | ORAL | Status: DC

## 2017-02-24 MED ORDER — METHIMAZOLE 5 MG PO TABS
5.0000 mg | ORAL_TABLET | Freq: Every day | ORAL | Status: DC
  Administered 2017-02-24: 5 mg via ORAL
  Filled 2017-02-24: qty 1

## 2017-02-24 MED ORDER — RIVAROXABAN 20 MG PO TABS: 20.0000 mg | ORAL_TABLET | Freq: Every day | ORAL | Status: DC

## 2017-02-24 MED ORDER — QUETIAPINE FUMARATE 25 MG PO TABS
25.0000 mg | ORAL_TABLET | Freq: Once | ORAL | Status: AC
  Administered 2017-02-24: 25 mg via ORAL
  Filled 2017-02-24: qty 1

## 2017-02-24 MED ORDER — CARVEDILOL 25 MG PO TABS
25.0000 mg | ORAL_TABLET | Freq: Two times a day (BID) | ORAL | Status: DC
  Administered 2017-02-24 (×2): 25 mg via ORAL
  Filled 2017-02-24 (×2): qty 1

## 2017-02-24 MED ORDER — RIVAROXABAN 15 MG PO TABS
15.0000 mg | ORAL_TABLET | Freq: Two times a day (BID) | ORAL | Status: DC
  Administered 2017-02-24: 15 mg via ORAL
  Filled 2017-02-24: qty 1

## 2017-02-24 MED ORDER — LORAZEPAM 2 MG/ML IJ SOLN
0.5000 mg | Freq: Once | INTRAMUSCULAR | Status: AC
  Administered 2017-02-24: 0.5 mg via INTRAVENOUS
  Filled 2017-02-24: qty 1

## 2017-02-24 MED ORDER — NALOXONE HCL 0.4 MG/ML IJ SOLN
INTRAMUSCULAR | Status: AC
  Filled 2017-02-24: qty 1

## 2017-02-25 ENCOUNTER — Ambulatory Visit: Payer: Medicare Other | Admitting: Internal Medicine

## 2017-02-25 MED FILL — Medication: Qty: 1 | Status: AC

## 2017-02-25 NOTE — Progress Notes (Signed)
CODE BLUE NOTE  Patient Name: Leslie Kline   MRN: [id number]   Date of Birth/ Sex: [birth year] , female      Admission Date: 02/12/2017  Attending Provider: No att. providers found  Primary Diagnosis: Dyspnea [R06.00] Bilateral pulmonary embolism (HCC) [I26.99] Other acute pulmonary embolism with acute cor pulmonale (HCC) [I26.09]    Indication: Pt was in her usual state of health until this PM, when she was noted to be have some chest tightness and ten suffered cardiac arrest. Code blue was subsequently called. At the time of arrival on scene, ACLS protocol was underway.   Technical Description:  - CPR performance duration:  25 minutes  - Was defibrillation or cardioversion used? Yes   - Was external pacer placed? Yes  - Was patient intubated pre/post CPR? Yes    Medications Administered: Y = Yes; Blank = No Amiodarone  N  Atropine  N  Calcium  Y  Epinephrine  Y  Lidocaine  N  Magnesium  Y  Norepinephrine  N  Phenylephrine  N  Sodium bicarbonate  N  Vasopressin  N  Other Y    Post CPR evaluation:  - Final Status - Was patient successfully resuscitated ? No. Patient was found to be in PEA with chest compressions underway with airway secured on my arrival. Pt had one brief episode of VFib and one shock was delivered and she converted back to PEA. Patient also received Tenecteplase 50mg  x 1.  Miscellaneous Information:  - Time of death:  08/14/14 PM  - Primary team notified?  Yes  - Family Notified? Yes        Arlyce Harman, DO   Mar 27, 2017, 4:33 AM

## 2017-03-06 ENCOUNTER — Other Ambulatory Visit: Payer: Self-pay | Admitting: Cardiology

## 2017-03-06 NOTE — Progress Notes (Signed)
Addendum to P.T. Note 2023/03/06   2017-03-05 1353  PT G-Codes **NOT FOR INPATIENT CLASS**  Functional Assessment Tool Used Clinical judgement  Functional Limitation Mobility: Walking and moving around  Mobility: Walking and Moving Around Current Status [phone]) CJ  Mobility: Walking and Moving Around Goal Status (E0223) CI  Delaney Meigs, PT [phone]

## 2017-03-12 ENCOUNTER — Institutional Professional Consult (permissible substitution): Payer: Medicare Other | Admitting: Internal Medicine

## 2017-03-25 NOTE — Progress Notes (Signed)
PULMONARY / CRITICAL CARE MEDICINE   Name: Leslie CurtisMary Kline MRN: [id number] DOB: 04/30/1940    ADMISSION DATE:  01/29/2017 CONSULTATION DATE:  11/30  REFERRING MD:  Dr. Freida BusmanAllen EDP  CHIEF COMPLAINT:  SOB  HISTORY OF PRESENT ILLNESS:   77 year old female with PMH as below, which is significant for hyperthyroidism (poor med compliance hx RAI and recently admitted, discharged 11/27), systolic CHF, NICM, and hypertension. As mentioned, she presented 11/25 for SOB and tachycardia with TSH 0.002 and free T4 elevated. She was admitted for2 days and had medications straightened out and was discharged home. She did have a VQ scan at that time for SOB, which was "indeterminate" for PE. It is unclear why she did not get CT at that time. She was discharged to home. Since discharge she reports not eating or drinking much at all. She did initially feel better, however, 11/28 late PM she developed SOB. This persisted all day 11/29 and she again presented to ED with this complaint. CTA chest was done and demonstrated large bilateral pulmonary embolism. She did have some associated hypotension and PCCM was asked to admit.   SUBJECTIVE:  No events overnight, no new complaints  VITAL SIGNS: BP (!) 158/98   Pulse (!) 111   Temp 99 F (37.2 C) (Oral)   Resp (!) 27   Wt 91.5 kg (201 lb 11.5 oz)   SpO2 94%   BMI 36.90 kg/m   HEMODYNAMICS:    VENTILATOR SETTINGS:    INTAKE / OUTPUT: I/O last 3 completed shifts: In: 933.8 [P.O.:220; I.V.:713.8] Out: 950 [Urine:950]  PHYSICAL EXAMINATION: Gen:      Chronically ill appearing female, NAD HEENT:  [place]/AT, PERRL, EOM-I Neck:      Supple Lungs:    CTA bilaterally  CV:         RRR, Nl S1/S2 and -M/R/G. Abd:      Soft, NT, ND and +BS Ext:    No edema; adequate peripheral perfusion Skin:       Intact Neuro:    Awake and interactive, moving all ext to command  LABS:  BMET Recent Labs  Lab 02/21/17 0607 02/23/17 0501 07/24/16 0317  NA 141 142 140  K  4.2 4.5 4.3  CL 109 112* 108  CO2 22 19* 20*  BUN 32* 29* 24*  CREATININE 0.89 0.88 0.85  GLUCOSE 128* 100* 100*   Electrolytes Recent Labs  Lab 02/11/2017 2340 02/21/17 0607 02/23/17 0501 07/24/16 0317  CALCIUM 8.8* 8.5* 8.5* 8.3*  MG 2.4  --  2.3 2.1  PHOS  --   --  3.7 3.6   CBC Recent Labs  Lab 02/22/17 0340 02/23/17 0501 07/24/16 0317  WBC 6.8 8.2 8.7  HGB 14.9 15.2* 14.9  HCT 45.4 46.1* 45.6  PLT 180 183 153   Coag's Recent Labs  Lab 02/21/17 0235  APTT 26  INR 1.16   Sepsis Markers No results for input(s): LATICACIDVEN, PROCALCITON, O2SATVEN in the last 168 hours.  ABG No results for input(s): PHART, PCO2ART, PO2ART in the last 168 hours.  Liver Enzymes Recent Labs  Lab 02/18/17 0255  AST 20  ALT 9*  ALKPHOS 78  BILITOT 0.8  ALBUMIN 3.4*   Cardiac Enzymes Recent Labs  Lab 02/21/17 0607 02/21/17 1347  TROPONINI 0.06* 0.07*   Glucose Recent Labs  Lab 02/21/17 1500 02/21/17 2024  GLUCAP 114* 119*   Imaging No results found.  STUDIES:  Echo 11/26: LVEF 30-35%, Hypokinesis of the anterior, anterolateral, lateral,  inferolateral, inferior, and inferoseptal myocardium. Mod AR. RV moderately dilated, systolic function moderately reduced. RA severe dilation. VQ 11/27 > Indeterminate scan for pulmonary embolus. CT angio chest 11/30 > Acute bilateral occlusive pulmonary emboli with large clot burden. RIGHT heart strain (RV/LV Ratio = 2.3) consistent with at least submassive (intermediate risk) PE. RIGHT thyromegaly consistent with known goiter. LE duplex 11/30- No DVT  CULTURES: None  ANTIBIOTICS: None  SIGNIFICANT EVENTS: 11/25-11/27 admit for SOB, hyperthyroid 11/30- Admit with submassive PE. EKOS cancelled  LINES/TUBES:  I reviewed chest CT myself, PE noted  DISCUSSION: 77 year old female with hyperthyroidism and recent admission now admitted for submassive PE.  Patient was sent down for records.  Procedure to be aborted as she  cannot lie flat. Risk benefits of systemic TPA were discussed extensively with the patient and they declined.  We will continue heparin drip as she appears stable hemodynamically.  ASSESSMENT / PLAN:  PULMONARY A: Pulmonary Emboli:  Bilateral but R>L. Large clot burden. RV/LV ratio 2.3. PESI class III intermediate risk. Trop neg. Could argue R heart strain S1Q3T3 on ECG as well.  P:   Continue supplemental O2. Heparin infusion. EKOS cancelled No need for systemic TPA Echo from 11/26 probably representative of current issue will not repeat at this point.  Start NOAC or coumadin pending patient's preference Titrate O2 for sat of 88-92%  CARDIOVASCULAR A:  Chronic systolic CHF Prolonged QTc Hx HTN  P:  Telemetry monitoring in ICU May restart home carvedilol Trend troponin  RENAL A:   No acute issues  P:   Follow BMP Replace electrolytes as indicated  HEMATOLOGIC A:   PE - see above  P: Trend CBC Heparin infusion per pharmacy  INFECTIOUS A:   No acute issues  P:   Monitor WBC and fever curve  ENDOCRINE A:   Hyperthyroidism   P:   TSH low Continue methimazole  NEUROLOGIC A:   Anxiety  P:   Klonopin as needed for anxiety  FAMILY  - Updates: No family bedside  - Inter-disciplinary family meet or Palliative Care meeting due by:  12/6.  Discussed with Dr. Waymon Amato.  PCCM will sign off, please call back if needed.  Alyson Reedy, M.D. [facility] Pulmonary/Critical Care Medicine. Pager: [phone]. After hours pager: [phone].

## 2017-03-25 NOTE — Progress Notes (Signed)
Transferred -in from MICU by bed awake and alert. 

## 2017-03-25 NOTE — Progress Notes (Signed)
Report given to Chat RN as pt to be transferred to 4N06. VS WNL. Pt on heparin gtt. Will transfer via bed and 2 RNs.

## 2017-03-25 NOTE — Death Summary Note (Signed)
DEATH SUMMARY   Patient Details  Name: Leslie Kline MRN: [id number] DOB: 10/27/1940  Admission/Discharge Information   Admit Date:  04/29/2016  Date of Death: Date of Death: 03/16/2017  Time of Death: Time of Death: 2316  Length of Stay: 4  Referring Physician: Carlus PavlovGherghe, Cristina, MD   Reason(s) for Hospitalization    Diagnoses  Preliminary cause of death:  Acute bilateral submassive pulmonary embolism Secondary Diagnoses (including complications and co-morbidities):  Active Problems:   Acute hypoxemic respiratory failure [facility](HCC)   Pulmonary embolus [facility](HCC)   Brief Hospital Course (including significant findings, care, treatment, and services provided and events leading to death)  CCM to [facility] transfer 02/23/2017. 77 year old female with PMH of Graves' disease status post RAI treatment 2016 for a very large right thyroid nodule, noncompliant with her medications including methimazole, nonischemic cardiomyopathy with last EF of 30-35 percent by echo 02/17/17, hospitalized 02/16/17-02/18/17 for hyperthyroidism and impending thyroid storm, treated with propranolol, Lugol's iodine, hydrocortisone and methimazole and coordinated care with her endocrinologist. Troponins were elevated at that time felt to be due to tachycardia and demand ischemia from hyperthyroidism. VQ scan showed intermediate probability for PE but no anticoagulation started in the absence of definitive diagnosis but outpatient pulmonary consultation was recommended. CTA chest not done so as to not precipitate thyroid storm. She now returned with dyspnea. CTA demonstrated large bilateral pulmonary embolism with associated hypotension and CCM admitted. EKOS aborted as she could not lay flat. Patient and family declined TPA. Started on IV heparin drip. She remained hemodynamically stable. Oral anticoagulation options were discussed in detail with patient and family. They opted to start Xarelto. Patient was transferred to stepdown unit on  03/04/2017. However at approximately 11 PM on the same night, she complained of worsening dyspnea and went into a PEA cardiac arrest. She was unsuccessfully resuscitated for 25 minutes and subsequently demised.  Other problems managed during last hospitalization are as outlined below:     1. Acute bilateral submassive pulmonary embolism: Right heart strain noted on CTA chest 11/30. EKOS aborted this patient could not by flat. Patient declined TPA. Treated with IV heparin drip per pharmacy and had been on it since 11/29. She had remained hemodynamically stable. She was transitioned off of IV heparin to Xarelto on night of demise. 2. Acute on chronic systolic CHF/NICM: 2-D echo 02/17/17: LVEF 30-35 percent, hypokinesis of multiple walls are moderately dilated and systolic function moderately reduced, moderate TR and PR with severe pulmonary hypertension. Echo was not repeated since it was felt that recent echo represented current issue. Mildly volume overloaded. Was continued on oral Lasix. Coreg resumed.  3. Acute respiratory failure with hypoxia: Secondary to PE and CHF.  4. Essential hypertension: Uncontrolled. Resumed home carvedilol> not sure why she was not on Propanol d/t her thyroid disease.. 5. Prolonged QTC: EKG 11/29: QTC 482 ms. Potassium 4.3 and magnesium 2.1. 6. Hyperthyroidism/toxic goiter: Cause for recent admission for impending thyroid storm. Reported noncompliance with medications. Was on Methimazole.  7. Anxiety:  8. NSVT: Resumed carvedilol. Potassium and magnesium okay.       Pertinent Labs and Studies  Significant Diagnostic Studies Dg Chest 2 View  Result Date: 04/29/2016 CLINICAL DATA:  Short of breath EXAM: CHEST  2 VIEW COMPARISON:  02/18/2017, 04/13/2014 FINDINGS: No acute consolidation or pleural effusion. Stable enlarged cardiomediastinal silhouette with aortic atherosclerosis. No pneumothorax. Surgical clips at the left thoracic inlet. Degenerative changes of the  spine. Tracheal deviation to the left with right paratracheal opacity re- demonstrated. IMPRESSION:  No active cardiopulmonary disease. Cardiomegaly. Stable right paratracheal opacity. Electronically Signed   By: Jasmine Pang M.D.   On: 03/08/2017 23:17   Dg Chest 2 View  Result Date: 02/18/2017 CLINICAL DATA:  Shortness of breath EXAM: CHEST  2 VIEW COMPARISON:  Chest x-ray of 02/16/2017 FINDINGS: No active infiltrate or effusion is seen. Moderate cardiomegaly is stable. Mediastinal and hilar contours are unremarkable. There is deviation of the upper tracheal air shadow to the left probably due to enlargement of the right lobe of thyroid which appears stable. There are degenerative changes throughout the mid to lower thoracic spine. IMPRESSION: One stable moderate cardiomegaly. No definite active process. Next item probable enlargement of the right lobe thyroid. Correlate clinically. Electronically Signed   By: Dwyane Dee M.D.   On: 02/18/2017 08:54   Dg Chest 2 View  Result Date: 02/16/2017 CLINICAL DATA:  77 year old female with history of shortness breath since this morning. EXAM: CHEST  2 VIEW COMPARISON:  Chest x-ray 04/13/2014. FINDINGS: Lung volumes are normal. No consolidative airspace disease. No pleural effusions. No pneumothorax. No pulmonary nodule or mass noted. Pulmonary vasculature and the cardiomediastinal silhouette are within normal limits. Atherosclerosis in the thoracic aorta. IMPRESSION: 1.  No radiographic evidence of acute cardiopulmonary disease. 2. Aortic atherosclerosis. Electronically Signed   By: Trudie Reed M.D.   On: 02/16/2017 07:48   Ct Angio Chest Pe W And/or Wo Contrast  Result Date: 02/21/2017 CLINICAL DATA:  Hypoxia, tachycardia and dyspnea for 2 days. History of goiter , status post LEFT thyroidectomy. EXAM: CT ANGIOGRAPHY CHEST WITH CONTRAST TECHNIQUE: Multidetector CT imaging of the chest was performed using the standard protocol during bolus  administration of intravenous contrast. Multiplanar CT image reconstructions and MIPs were obtained to evaluate the vascular anatomy. CONTRAST:  100 cc Isovue 370 COMPARISON:  Chest radiograph 2017/03/08 and V/Q scan February 18, 2017 FINDINGS: CARDIOVASCULAR: Adequate contrast opacification of the pulmonary artery's. Main pulmonary artery is enlarged at 4.6 cm. Central pulmonary arterial filling defects involving all lobar artery's, predominately occlusive. The heart is moderately enlarged. RIGHT heart strain (RV/LV equals 2.3). No pericardial effusion. Thoracic aorta is normal course and caliber with mild calcific atherosclerosis. MEDIASTINUM/NODES: No lymphadenopathy by CT size criteria. LUNGS/PLEURA: Tracheobronchial tree is patent, no pneumothorax. No pleural effusions, focal consolidations, pulmonary nodules or masses. UPPER ABDOMEN: 15 mm cyst in dome of the liver.  Nonacute abdomen. MUSCULOSKELETAL: At least 5.7 x 8.9 cm RIGHT thyroid displacing the trachea to the LEFT. Status post LEFT thyroidectomy. Degenerative change of the thoracic spine superimposed on congenital canal narrowing resulting in multilevel neural foraminal narrowing. Review of the MIP images confirms the above findings. IMPRESSION: 1. Acute bilateral occlusive pulmonary emboli with large clot burden. RIGHT heart strain (RV/LV Ratio = 2.3) consistent with at least submassive (intermediate risk) PE. The presence of right heart strain has been associated with an increased risk of morbidity and mortality. Please activate Code PE by paging [phone]. 2. No acute pulmonary process.  Moderate cardiomegaly. 3. RIGHT thyromegaly consistent with known goiter. 4. Critical Value/emergent results were called by telephone at the time of interpretation on 02/21/2017 at 2:09 am to PA [facility] , who verbally acknowledged these results. Aortic Atherosclerosis (ICD10-I70.0). Electronically Signed   By: Awilda Metro M.D.   On: 02/21/2017  02:11   Nm Pulmonary Perf And Vent  Result Date: 02/18/2017 CLINICAL DATA:  Chronic shortness of breath. EXAM: NUCLEAR MEDICINE VENTILATION - PERFUSION LUNG SCAN TECHNIQUE: Ventilation images were obtained in  multiple projections using inhaled aerosol Tc-62m DTPA. Perfusion images were obtained in multiple projections after intravenous injection of Tc-66m MAA. RADIOPHARMACEUTICALS:  32.2 mCi Technetium-20m DTPA aerosol inhalation and 4.2 mCi Technetium-26m MAA IV COMPARISON:  Chest x-ray 02/18/2017. FINDINGS: Bilateral matching ventilation and perfusion defects are present making this an indeterminate scan for pulmonary embolus. Changes are most likely related to the pulmonary disease as ventilation defects are worse than perfusion defects. IMPRESSION: Indeterminate scan for pulmonary embolus. Electronically Signed   By: Maisie Fus  Register   On: 02/18/2017 11:17   Dg Chest Port 1 View  Result Date: 02/23/2017 CLINICAL DATA:  Acute respiratory failure EXAM: PORTABLE CHEST 1 VIEW COMPARISON:  02/19/2017 FINDINGS: Cardiomegaly. Tortuosity of the thoracic aorta. No confluent airspace opacities or effusions. Again noted is deviation of the trachea to the left related to enlarged right thyroid lobe. IMPRESSION: Cardiomegaly.  No active disease. Electronically Signed   By: Charlett Nose M.D.   On: 02/23/2017 07:32      Procedures/Operations    Marcellus Scott 03/06/2017, 5:33 PM

## 2017-03-25 NOTE — Progress Notes (Signed)
Patient found attempting to get out of bed, stated she felt as though she couldn't breath and needed to get up. Instructed patient that it was not safe for her to stand, and we were going to reposition her upright in bed. Patient pulled her arm away , lied back and immediately became bradycardic at which time the '[facility]' lever was pulled and CPR was initiated.

## 2017-03-25 NOTE — Progress Notes (Signed)
ANTICOAGULATION CONSULT NOTE  Pharmacy Consult for IV heparin Indication: pulmonary embolus  No Known Allergies  Patient Measurements: Heparin Dosing Weight: 62 kg  Vital Signs: Temp: 99 F (37.2 C) (12/03 1100) Temp Source: Oral (12/03 1100) BP: 158/98 (12/03 0933) Pulse Rate: 111 (12/03 0933)  Labs: Recent Labs    02/22/17 0340  02/23/17 0501 02/23/17 1401 03/20/2017 0317  HGB 14.9  --  15.2*  --  14.9  HCT 45.4  --  46.1*  --  45.6  PLT 180  --  183  --  153  HEPARINUNFRC 0.32   < > 0.26* 0.53 0.39  CREATININE  --   --  0.88  --  0.85   < > = values in this interval not displayed.     Assessment: 73 yoF with acute bilateral occlusive PE with large clot burden and right heart strain. Heparin level remains therapeutic. Cbc stable.   Goal of Therapy:  Heparin level 0.3-0.7 units/ml Monitor platelets by anticoagulation protocol: Yes   Plan:  Continue heparin gtt at 1500 units/hr  Daily heparin level and CBC  Monitor for s/s bleeding  Switch to oral DOAC today based on insurance coverage    Vinnie Level, PharmD., BCPS Clinical Pharmacist Pager [phone]

## 2017-03-25 NOTE — Progress Notes (Signed)
Paged regarding CPR in progress. Upon arrival to the bedside the code team was present including RRN and CCM. Code called at 2316. Spoke with pts daughter upon arrival to the bedside regarding the events that led up to the cardiac arrest. Daughter was understandably tearful at the time but had no quesitons. Chaplain at bedside as well. Death Certificate completed and given to bedside RN.   Stevie Kern NP-C, AGPCNP-BC [facility] Pager [phone]

## 2017-03-25 NOTE — Progress Notes (Signed)
ANTICOAGULATION CONSULT NOTE  Pharmacy Consult for IV heparin> rivaroxaban Indication: pulmonary embolus  No Known Allergies  Patient Measurements: Heparin Dosing Weight: 62 kg  Vital Signs: Temp: 97.9 F (36.6 C) (12/03 1513) Temp Source: Oral (12/03 1513) BP: 127/63 (12/03 1513) Pulse Rate: 108 (12/03 1513)  Labs: Recent Labs    02/22/17 0340  02/23/17 0501 02/23/17 1401 03/03/2017 0317  HGB 14.9  --  15.2*  --  14.9  HCT 45.4  --  46.1*  --  45.6  PLT 180  --  183  --  153  HEPARINUNFRC 0.32   < > 0.26* 0.53 0.39  CREATININE  --   --  0.88  --  0.85   < > = values in this interval not displayed.     Assessment: 64 yoF with acute bilateral occlusive PE with large clot burden and right heart strain. Heparin level remains therapeutic. Cbc stable.  Per CM DOACs covered without prior authorization. Start rivaroxaban tonight and stop heparin  Goal of Therapy:  Heparin level 0.3-0.7 units/ml Monitor platelets by anticoagulation protocol: Yes   Plan:  stop heparin gtt at 2200 tonight rivaroxaban 15mg  BID x21 days then 20mg  daily   Leota Sauers Pharm.D. CPP, BCPS Clinical Pharmacist [phone] 03/17/2017 6:10 PM

## 2017-03-25 NOTE — Progress Notes (Addendum)
PROGRESS NOTE   Leslie Kline  UYQ:[id number]    DOB: 08-03-1940    DOA: 01/23/2017  PCP: Carlus Pavlov, MD   I have briefly reviewed patients previous medical records in [facility].  Brief Narrative:  CCM to [facility] transfer 02/23/2017. 77 year old female with PMH of Graves' disease status post RAI treatment 2016 for a very large right thyroid nodule, noncompliant with her medications including methimazole, nonischemic cardiomyopathy with last EF of 30-35 percent by echo 02/17/17, hospitalized 02/16/17-02/18/17 for hyperthyroidism and impending thyroid storm, treated with propranolol, Lugol's iodine, hydrocortisone and methimazole and coordinated care with her endocrinologist. Troponins were elevated at that time felt to be due to tachycardia and demand ischemia from hyperthyroidism. VQ scan showed intermediate probability for PE but no anticoagulation started in the absence of definitive diagnosis but outpatient pulmonary consultation was recommended. CTA chest not done so wouldn't precipitate thyroid storm. She now returned with dyspnea. CTA demonstrated large bilateral pulmonary embolism with associated hypotension and CCM admitted. EKOS aborted as she could not lay flat. Patient and family declined TPA. Started on IV heparin drip. Plans to transition to oral anticoagulation on 12/3.  SIGNIFICANT EVENTS: 11/25-11/27 admit for SOB, hyperthyroid 11/30- Admit with submassive PE. EKOS cancelled   Assessment & Plan:   Active Problems:   Acute hypoxemic respiratory failure (HCC)   Pulmonary embolus (HCC)   1. Acute bilateral submassive pulmonary embolism: Right heart strain noted on CTA chest 11/30. EKOS aborted this patient could not by flat. Patient declined TPA. Treated with IV heparin drip per pharmacy and has been on it since 11/29. Remains hemodynamically stable. I and pharmacist discussed in detail with patient and daughter at bedside regarding risks and benefits of oral anticoagulation  options available (warfarin versus DOAC's versus Pradaxa) and risks and benefits of each. They are leaning towards DOAC's. Case management consulted to see which one is the cheapest option for them. Hold home aspirin in case DOAC started. 2. Acute on chronic systolic CHF/NICM: 2-D echo 02/17/17: LVEF 30-35 percent, hypokinesis of multiple walls are moderately dilated and systolic function moderately reduced, moderate TR and PR with severe pulmonary hypertension. Echo was not repeated since it was felt that recent echo represented current issue. Mildly volume overloaded. Continue oral Lasix but increase to prior home dose from 40-80 mg daily. Strict intake output and daily weights. Coreg resume. Start ACEI in a day or 2 pending stability of hemodynamics. 3. Acute respiratory failure with hypoxia: Secondary to PE and CHF. Titrate oxygen to saturations of 88-92 percent. 4. Essential hypertension: Uncontrolled. Resume home carvedilol> why not on Propanol d/t her thyroid disease.. 5. Prolonged QTC: EKG 11/29: QTC 482 ms. Monitor on telemetry. Avoid precipitating medications. Potassium 4.3 and magnesium 2.1. 6. Hyperthyroidism/toxic goiter: Cause for recent admission for impending thyroid storm. Reported noncompliance with medications. Continue methimazole. Outpatient follow-up with Endocrinology 7. Anxiety: When necessary Klonopin. Delirium precautions. 8. NSVT: None seen on monitor in the last 24 hours. Resume carvedilol. Potassium and magnesium okay.   DVT prophylaxis: Currently on IV heparin infusion. Code Status: Full. Family Communication: Discussed in detail with patient's daughter at bedside. Updated care and answered questions. Disposition: Transfer to stepdown unit on 12/3. DC home when medically improved. Mobilize. May consider PT evaluation.   Consultants:  CCM-signed off 12/3. Discussed with CCM.   Procedures:  None  Antimicrobials:  None    Subjective: Feels much better. Dyspnea  improved but not yet at baseline. Unable to sleep well at night. Mild headache. No chest  pain, palpitations, dizziness or lightheadedness. No bleeding reported.   ROS: As above  Objective:  Vitals:   02/22/2017 0800 03/04/2017 0803 03/10/2017 0933 03/06/2017 1100  BP: 120/82  (!) 158/98   Pulse: (!) 107  (!) 111   Resp: (!) 28  (!) 27   Temp:  99.9 F (37.7 C)  99 F (37.2 C)  TempSrc:  Axillary  Oral  SpO2: 93%  94%   Weight:        Examination:  General exam: Elderly female, moderately built and nourished, lying comfortably propped up in bed. Respiratory system: Slightly diminished breath sounds in the bases with occasional basal crackles. Rest of lung fields clear to auscultation. Respiratory effort normal. Cardiovascular system: S1 & S2 heard, RRR. No JVD, murmurs, rubs, gallops or clicks. Trace bilateral ankle edema and sacral edema. Telemetry: Sinus tachycardia in the 100s. Gastrointestinal system: Abdomen is nondistended, soft and nontender. No organomegaly or masses felt. Normal bowel sounds heard. Central nervous system: Alert and oriented. No focal neurological deficits. Extremities: Symmetric 5 x 5 power. Skin: No rashes, lesions or ulcers Psychiatry: Judgement and insight Impaired. Mood & affect appropriate.     Data Reviewed: I have personally reviewed following labs and imaging studies  CBC: Recent Labs  Lab 02/18/17 0255 01/28/2017 2340 02/21/17 0607 02/21/17 1347 02/22/17 0340 02/23/17 0501 03/11/2017 0317  WBC 6.2 6.0 6.6 6.3 6.8 8.2 8.7  NEUTROABS 5.1 3.3  --   --   --   --   --   HGB 14.3 14.7 14.3 15.0 14.9 15.2* 14.9  HCT 44.7 44.7 42.9 44.5 45.4 46.1* 45.6  MCV 85.8 84.3 84.4 83.2 85.0 85.5 84.8  PLT 232 191 191 301 180 183 153   Basic Metabolic Panel: Recent Labs  Lab 02/18/17 0255 02/04/2017 2340 02/21/17 0607 02/23/17 0501 03/19/2017 0317  NA 139 142 141 142 140  K 4.2 4.3 4.2 4.5 4.3  CL 107 109 109 112* 108  CO2 21* 23 22 19* 20*  GLUCOSE 148*  125* 128* 100* 100*  BUN 43* 39* 32* 29* 24*  CREATININE 0.88 0.91 0.89 0.88 0.85  CALCIUM 9.0 8.8* 8.5* 8.5* 8.3*  MG 2.5* 2.4  --  2.3 2.1  PHOS  --   --   --  3.7 3.6   Liver Function Tests: Recent Labs  Lab 02/18/17 0255  AST 20  ALT 9*  ALKPHOS 78  BILITOT 0.8  PROT 7.1  ALBUMIN 3.4*   Coagulation Profile: Recent Labs  Lab 02/21/17 0235  INR 1.16   Cardiac Enzymes: Recent Labs  Lab 02/21/17 0607 02/21/17 1347  TROPONINI 0.06* 0.07*   HbA1C: No results for input(s): HGBA1C in the last 72 hours. CBG: Recent Labs  Lab 02/21/17 1500 02/21/17 2024  GLUCAP 114* 119*    Recent Results (from the past 240 hour(s))  Urine Culture     Status: Abnormal   Collection Time: 02/16/17  2:26 PM  Result Value Ref Range Status   Specimen Description URINE, CLEAN CATCH  Final   Special Requests NONE  Final   Culture MULTIPLE SPECIES PRESENT, SUGGEST RECOLLECTION (A)  Final   Report Status 02/18/2017 FINAL  Final  MRSA PCR Screening     Status: None   Collection Time: 02/16/17  4:06 PM  Result Value Ref Range Status   MRSA by PCR NEGATIVE NEGATIVE Final    Comment:        The GeneXpert MRSA Assay (FDA approved for NASAL specimens only), is  one component of a comprehensive MRSA colonization surveillance program. It is not intended to diagnose MRSA infection nor to guide or monitor treatment for MRSA infections.   MRSA PCR Screening     Status: None   Collection Time: 02/21/17  3:01 PM  Result Value Ref Range Status   MRSA by PCR NEGATIVE NEGATIVE Final    Comment:        The GeneXpert MRSA Assay (FDA approved for NASAL specimens only), is one component of a comprehensive MRSA colonization surveillance program. It is not intended to diagnose MRSA infection nor to guide or monitor treatment for MRSA infections.          Radiology Studies: Dg Chest Port 1 View  Result Date: 02/23/2017 CLINICAL DATA:  Acute respiratory failure EXAM: PORTABLE CHEST 1  VIEW COMPARISON:  02/04/2017 FINDINGS: Cardiomegaly. Tortuosity of the thoracic aorta. No confluent airspace opacities or effusions. Again noted is deviation of the trachea to the left related to enlarged right thyroid lobe. IMPRESSION: Cardiomegaly.  No active disease. Electronically Signed   By: Charlett Nose M.D.   On: 02/23/2017 07:32        Scheduled Meds: . furosemide  40 mg Oral Daily  . mouth rinse  15 mL Mouth Rinse BID  . pantoprazole (PROTONIX) IV  40 mg Intravenous QHS   Continuous Infusions: . sodium chloride    . sodium chloride 10 mL/hr at 02/23/17 0700  . heparin 1,500 Units/hr (02/23/2017 0933)     LOS: 3 days     Marcellus Scott, MD, FACP, [facility]. [facility] Pager [phone]  If 7PM-7AM, please contact night-coverage [email or web address] Password [email or web address] 03/02/2017, 12:45 PM

## 2017-03-25 NOTE — Procedures (Signed)
Intubation Procedure Note Leslie Kline [id number] May 11, 1940  Procedure: Intubation Indications: Respiratory insufficiency  Procedure Details Consent: Unable to obtain consent because of emergent medical necessity. Time Out: Verified patient identification, verified procedure, site/side was marked, verified correct patient position, special equipment/implants available, medications/allergies/relevent history reviewed, required imaging and test results available.  Performed  Maximum sterile technique was used including gloves, gown and mask.  MAC and 3    Evaluation Hemodynamic Status: Persistent hypotension treated with pressors and fluid; O2 sats: currently acceptable Patient's Current Condition: unstable Complications: No apparent complications Patient did tolerate procedure well. Chest X-ray ordered to verify placement.  CXR: pending.  Cardiac Arrest patient in PEA. Leslie Kline 03/14/2017

## 2017-03-25 NOTE — Progress Notes (Signed)
Eliquis is a covered drug and doesn't require prior auth at 5mg  for 30 days; co-ins  is 20% of cost.     Xarelto 15mg  21day 2 tabs a day and 20 mg 30 days 1 tab a day covered No prior auth the co-ins is 20% of cost   Both meds are essentially covered the same and cover 80% of the cost of med.  30 day free trial cards are available for both medications.    Quintella Baton, RN, BSN  Trauma/Neuro ICU Case Manager [phone]

## 2017-03-25 NOTE — Progress Notes (Signed)
eLink Physician-Brief Progress Note Patient Name: Leslie Kline DOB: [birth year] MRN: [id number]   Date of Service  02/27/2017  HPI/Events of Note  Anxiety - Not improved with Klonopin and Ambien (for sleep).  eICU Interventions  Will order Seroquel 25 mg PO now.      Intervention Category Minor Interventions: Agitation / anxiety - evaluation and management  Vyolet Sakuma [name] 03/11/2017, 2:55 AM

## 2017-03-25 NOTE — Progress Notes (Signed)
Notified provider pt is c/o SOB,  o2 sats 95% on 5L but is very anxious, and tachypneic. States she feels like she cant catch her breath.

## 2017-03-25 NOTE — Progress Notes (Addendum)
PCCM INTERVAL PROGRESS NOTE  Called to bedside for cardiac arrest. Patient admitted with PE. Heparin stopped tonight in favor of xarelto. Patient soon after complained of chest tightness and suffered a cardiac arrest. See code sheet for further details. Code run for 25 minutes and drugs given included epinephrine, bicarb, calcium, mag, amiodarone and TNK. No ROSC. TOD 2316  Joneen Roach, AGACNP-BC [facility] Pulmonology/Critical Care Pager [phone] or [phone]  03/05/2017 11:27 PM

## 2017-03-25 NOTE — Progress Notes (Signed)
Chaplain responded to a Code Blue for this patient. Upon arrival to the Unit CPR was in progress. The family was notified by the Unit Nurse of the patient's critical status. After an extended period of time the Code was stopped, the patient died. At the family's arrival Chaplain provided the ministry of presence for the large family and they arrived and notified of the death of the patient. Comfort measures, encouragement , and a prayer of comfort, and peace were extended for the family. Chaplain was informed by family members that the patient was a Renato G[employer] of [employer], and they were people of faith, and believed in the reality of heaven. Chaplain informed the daughter of all information needed by the staff for final arrangements for the family. They were appreciative of all the support by the staff. Chaplain Janell Quiet

## 2017-03-25 NOTE — Discharge Instructions (Signed)
Information on my medicine - XARELTO (rivaroxaban)  This medication education was reviewed with me or my healthcare representative as part of my discharge preparation.   WHY WAS XARELTO PRESCRIBED FOR YOU? Xarelto was prescribed to treat blood clots that may have been found in the veins of your legs (deep vein thrombosis) or in your lungs (pulmonary embolism) and to reduce the risk of them occurring again.  What do you need to know about Xarelto? The starting dose is one 15 mg tablet taken TWICE daily with food for the FIRST 21 DAYS then on 03/18/2017  the dose is changed to one 20 mg tablet taken ONCE A DAY with your evening meal.  DO NOT stop taking Xarelto without talking to the health care provider who prescribed the medication.  Refill your prescription for 20 mg tablets before you run out.  After discharge, you should have regular check-up appointments with your healthcare provider that is prescribing your Xarelto.  In the future your dose may need to be changed if your kidney function changes by a significant amount.  What do you do if you miss a dose? If you are taking Xarelto TWICE DAILY and you miss a dose, take it as soon as you remember. You may take two 15 mg tablets (total 30 mg) at the same time then resume your regularly scheduled 15 mg twice daily the next day.  If you are taking Xarelto ONCE DAILY and you miss a dose, take it as soon as you remember on the same day then continue your regularly scheduled once daily regimen the next day. Do not take two doses of Xarelto at the same time.   Important Safety Information Xarelto is a blood thinner medicine that can cause bleeding. You should call your healthcare provider right away if you experience any of the following: ? Bleeding from an injury or your nose that does not stop. ? Unusual colored urine (red or dark brown) or unusual colored stools (red or black). ? Unusual bruising for unknown reasons. ? A serious fall  or if you hit your head (even if there is no bleeding).  Some medicines may interact with Xarelto and might increase your risk of bleeding while on Xarelto. To help avoid this, consult your healthcare provider or pharmacist prior to using any new prescription or non-prescription medications, including herbals, vitamins, non-steroidal anti-inflammatory drugs (NSAIDs) and supplements.  This website has more information on Xarelto: [email or web address].

## 2017-03-25 DEATH — deceased

## 2018-03-28 IMAGING — DX DG CHEST 2V
2 series · 2 of 2 positions shown · non-contrast
Comparison: Chest x-ray 04/13/2014.

CLINICAL DATA: 76-year-old female with history of shortness breath
since this morning.

EXAM:
CHEST  2 VIEW

[x chest ap]
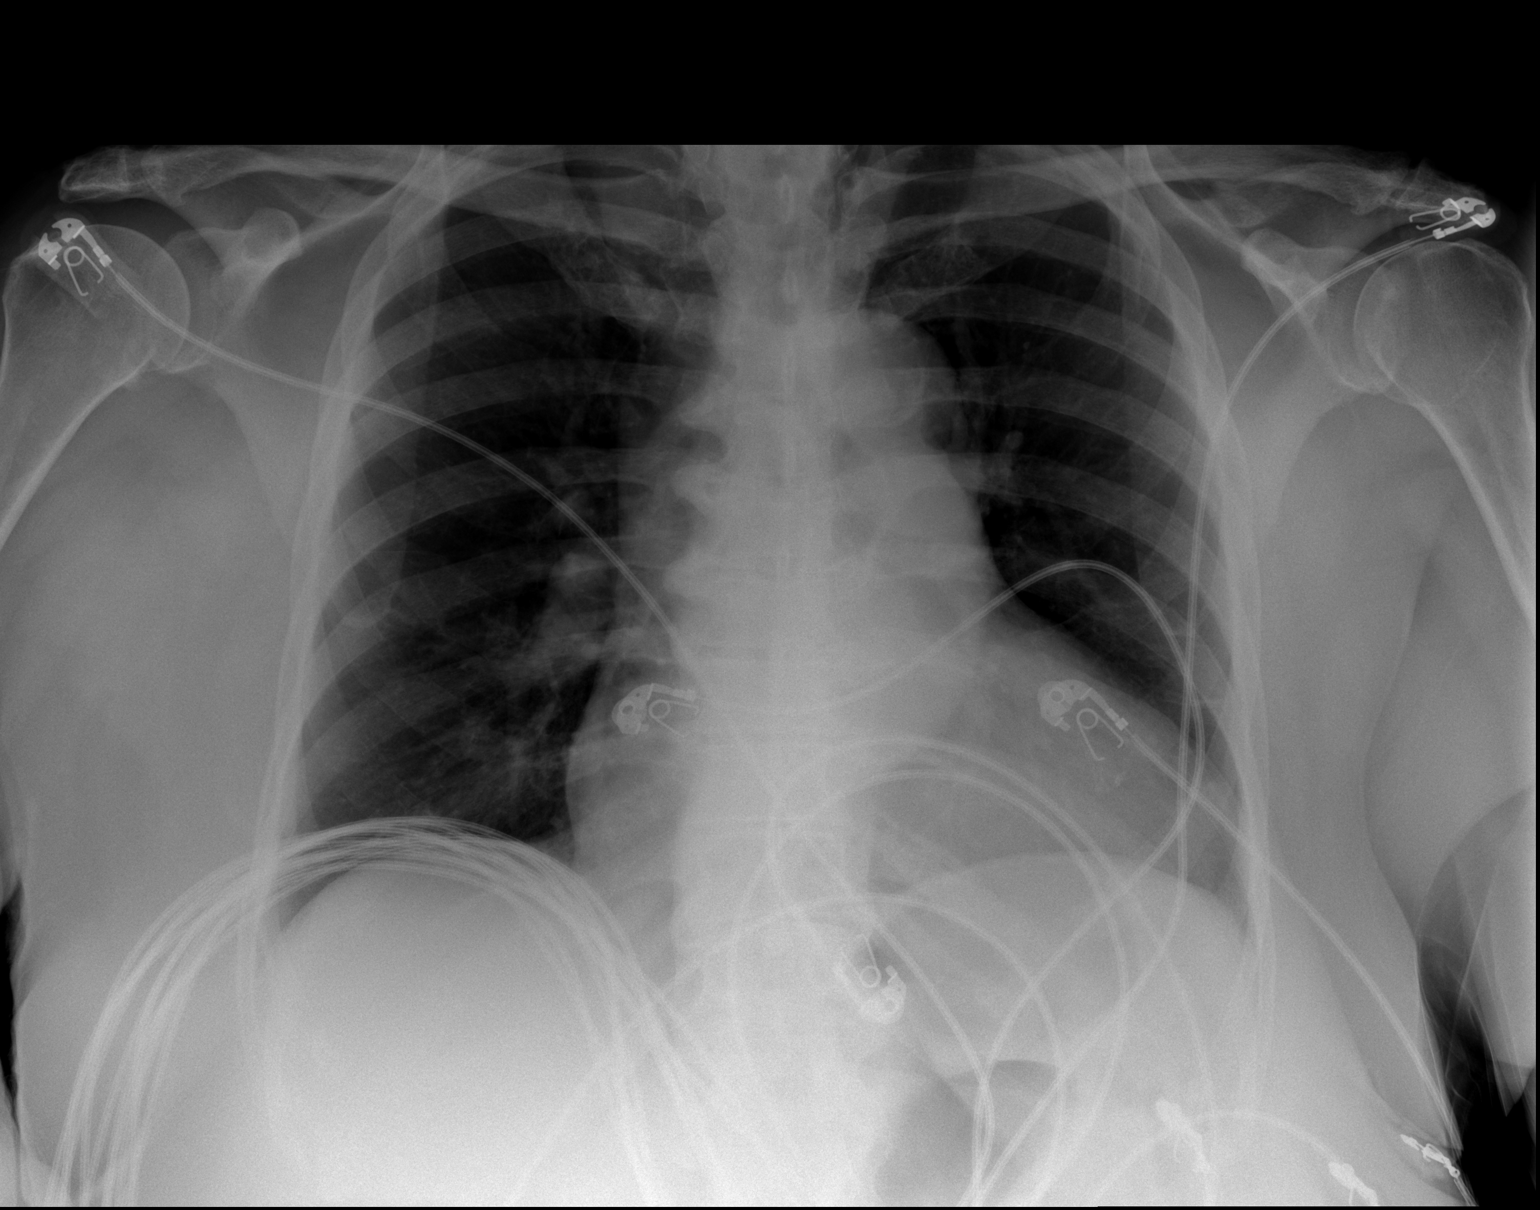

[w chest lat]
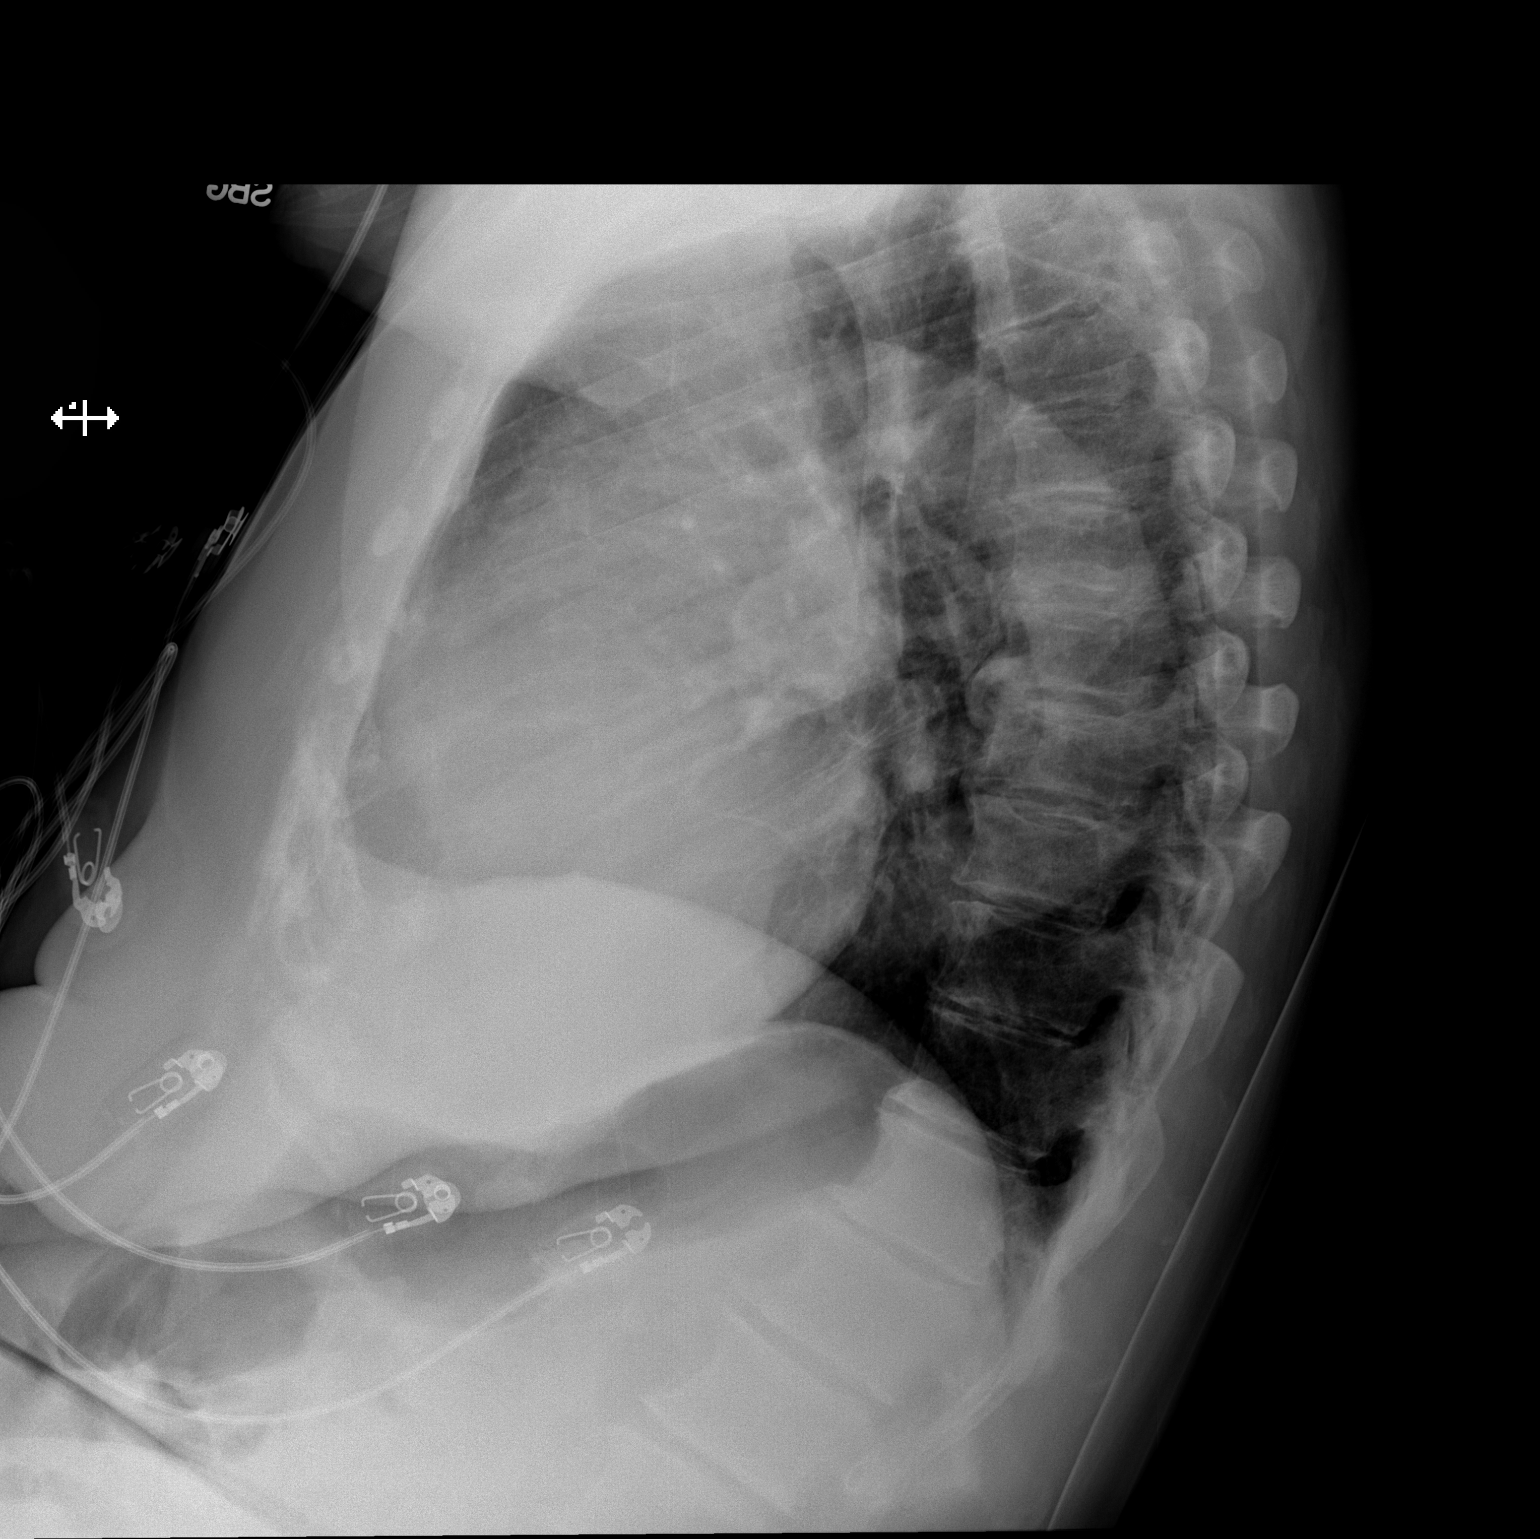

[2 of 2 positions shown; findings below may reference images not displayed]

FINDINGS: Lung volumes are normal. No consolidative airspace disease. No
pleural effusions. No pneumothorax. No pulmonary nodule or mass
noted. Pulmonary vasculature and the cardiomediastinal silhouette
are within normal limits. Atherosclerosis in the thoracic aorta.
IMPRESSION: 1.  No radiographic evidence of acute cardiopulmonary disease.
2. Aortic atherosclerosis.

## 2018-03-30 IMAGING — NM NM PULMONARY VENT & PERF
16 series · 16 of 16 positions shown · non-contrast
Comparison: Chest x-ray 02/18/2017.

CLINICAL DATA: Chronic shortness of breath.

EXAM:
NUCLEAR MEDICINE VENTILATION - PERFUSION LUNG SCAN
TECHNIQUE: Ventilation images were obtained in multiple projections using
inhaled aerosol Pc-66m DTPA. Perfusion images were obtained in
multiple projections after intravenous injection of Pc-66m MAA.
RADIOPHARMACEUTICALS:  32.2 mCi Pechnetium-FFm DTPA aerosol
inhalation and 4.2 mCi Pechnetium-FFm MAA IV

[Series 1: ant/post vent · 4.14mm/px · 1 of 1 slices shown (1 of 2)]
[im 1/1]
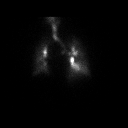

[Series 1: ant/post vent · 4.14mm/px · 1 of 1 slices shown (2 of 2)]
[im 1/1]
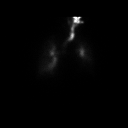

[Series 2: lao/rpo vent · 4.14mm/px · 1 of 1 slices shown (1 of 2)]
[im 1/1]
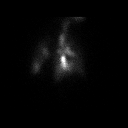

[Series 2: lao/rpo vent · 4.14mm/px · 1 of 1 slices shown (2 of 2)]
[im 1/1]
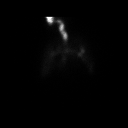

[Series 3: lpo/rao vent · 4.14mm/px · 1 of 1 slices shown (1 of 2)]
[im 1/1]
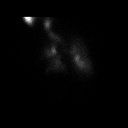

[Series 3: lpo/rao vent · 4.14mm/px · 1 of 1 slices shown (2 of 2)]
[im 1/1]
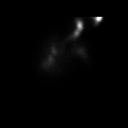

[Series 4: lt lat/rt lat vent · 4.14mm/px · 1 of 1 slices shown (1 of 2)]
[im 1/1]
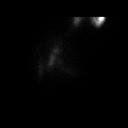

[Series 4: lt lat/rt lat vent · 4.14mm/px · 1 of 1 slices shown (2 of 2)]
[im 1/1]
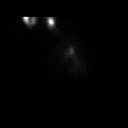

[Series 5: lt lat/rt lat perf · 4.14mm/px · 1 of 1 slices shown (1 of 2)]
[im 1/1]
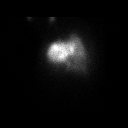

[Series 5: lt lat/rt lat perf · 4.14mm/px · 1 of 1 slices shown (2 of 2)]
[im 1/1]
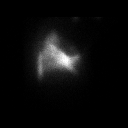

[Series 6: lpo/rao perf · 4.14mm/px · 1 of 1 slices shown (1 of 2)]
[im 1/1]
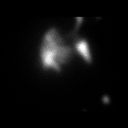

[Series 6: lpo/rao perf · 4.14mm/px · 1 of 1 slices shown (2 of 2)]
[im 1/1]
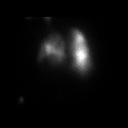

[Series 7: ant/post perf · 4.14mm/px · 1 of 1 slices shown (1 of 2)]
[im 1/1]
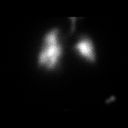

[Series 7: ant/post perf · 4.14mm/px · 1 of 1 slices shown (2 of 2)]
[im 1/1]
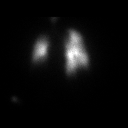

[Series 8: lao/rpo perf · 4.14mm/px · 1 of 1 slices shown (1 of 2)]
[im 1/1]
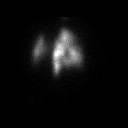

[Series 8: lao/rpo perf · 4.14mm/px · 1 of 1 slices shown (2 of 2)]
[im 1/1]
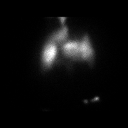

[16 of 16 positions shown; findings below may reference images not displayed]

FINDINGS: Bilateral matching ventilation and perfusion defects are present
making this an indeterminate scan for pulmonary embolus. Changes are
most likely related to the pulmonary disease as ventilation defects
are worse than perfusion defects.
IMPRESSION: Indeterminate scan for pulmonary embolus.
